# Patient Record
Sex: Female | Born: 1995 | Race: Black or African American | Hispanic: No | Marital: Single | Smoking: Never smoker
Health system: Southern US, Community
[De-identification: ages and names within clinical notes are randomized; demographics above are authoritative.]

## PROBLEM LIST (undated history)

## (undated) ENCOUNTER — Inpatient Hospital Stay (HOSPITAL_COMMUNITY): Payer: Self-pay

## (undated) DIAGNOSIS — K219 Gastro-esophageal reflux disease without esophagitis: Secondary | ICD-10-CM

## (undated) DIAGNOSIS — F339 Major depressive disorder, recurrent, unspecified: Secondary | ICD-10-CM

## (undated) DIAGNOSIS — F4320 Adjustment disorder, unspecified: Secondary | ICD-10-CM

## (undated) DIAGNOSIS — F819 Developmental disorder of scholastic skills, unspecified: Secondary | ICD-10-CM

## (undated) DIAGNOSIS — E559 Vitamin D deficiency, unspecified: Secondary | ICD-10-CM

## (undated) DIAGNOSIS — S0591XA Unspecified injury of right eye and orbit, initial encounter: Secondary | ICD-10-CM

## (undated) DIAGNOSIS — F909 Attention-deficit hyperactivity disorder, unspecified type: Secondary | ICD-10-CM

## (undated) DIAGNOSIS — D563 Thalassemia minor: Secondary | ICD-10-CM

## (undated) HISTORY — DX: Major depressive disorder, recurrent, unspecified: F33.9

## (undated) HISTORY — DX: Gastro-esophageal reflux disease without esophagitis: K21.9

## (undated) HISTORY — DX: Developmental disorder of scholastic skills, unspecified: F81.9

## (undated) HISTORY — DX: Attention-deficit hyperactivity disorder, unspecified type: F90.9

---
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
## 2017-02-04 ENCOUNTER — Other Ambulatory Visit: Payer: Self-pay

## 2017-02-04 ENCOUNTER — Encounter (HOSPITAL_COMMUNITY): Payer: Self-pay | Admitting: Emergency Medicine

## 2017-02-04 ENCOUNTER — Ambulatory Visit (HOSPITAL_COMMUNITY)
Admission: EM | Admit: 2017-02-04 | Discharge: 2017-02-04 | Disposition: A | Discharge status: Home / Self Care | Payer: 59 | Attending: Family Medicine | Admitting: Family Medicine

## 2017-02-04 DIAGNOSIS — N898 Other specified noninflammatory disorders of vagina: Secondary | ICD-10-CM | POA: Diagnosis not present

## 2017-02-04 DIAGNOSIS — Z113 Encounter for screening for infections with a predominantly sexual mode of transmission: Secondary | ICD-10-CM | POA: Diagnosis not present

## 2017-02-04 DIAGNOSIS — R112 Nausea with vomiting, unspecified: Secondary | ICD-10-CM | POA: Insufficient documentation

## 2017-02-04 DIAGNOSIS — R109 Unspecified abdominal pain: Secondary | ICD-10-CM | POA: Diagnosis not present

## 2017-02-04 DIAGNOSIS — Z202 Contact with and (suspected) exposure to infections with a predominantly sexual mode of transmission: Secondary | ICD-10-CM | POA: Diagnosis not present

## 2017-02-04 DIAGNOSIS — F909 Attention-deficit hyperactivity disorder, unspecified type: Secondary | ICD-10-CM | POA: Insufficient documentation

## 2017-02-04 DIAGNOSIS — Z3202 Encounter for pregnancy test, result negative: Secondary | ICD-10-CM

## 2017-02-04 LAB — POCT URINALYSIS DIP (DEVICE)
Bilirubin Urine: NEGATIVE
Glucose, UA: NEGATIVE mg/dL
Ketones, ur: NEGATIVE mg/dL
Leukocytes, UA: NEGATIVE
NITRITE: NEGATIVE
PH: 6.5 (ref 5.0–8.0)
Protein, ur: NEGATIVE mg/dL
Specific Gravity, Urine: 1.025 (ref 1.005–1.030)
UROBILINOGEN UA: 0.2 mg/dL (ref 0.0–1.0)

## 2017-02-04 LAB — POCT PREGNANCY, URINE: Preg Test, Ur: NEGATIVE

## 2017-02-04 MED ORDER — AZITHROMYCIN 250 MG PO TABS
1000.0000 mg | ORAL_TABLET | Freq: Once | ORAL | Status: AC
  Administered 2017-02-04: 1000 mg via ORAL

## 2017-02-04 MED ORDER — ONDANSETRON 4 MG PO TBDP
ORAL_TABLET | ORAL | Status: AC
  Filled 2017-02-04: qty 1

## 2017-02-04 MED ORDER — CEFTRIAXONE SODIUM 250 MG IJ SOLR
INTRAMUSCULAR | Status: AC
  Filled 2017-02-04: qty 250

## 2017-02-04 MED ORDER — AZITHROMYCIN 250 MG PO TABS
ORAL_TABLET | ORAL | Status: AC
Start: 2017-02-04 — End: ?
  Filled 2017-02-04: qty 4

## 2017-02-04 MED ORDER — ONDANSETRON HCL 4 MG PO TABS: 4.0000 mg | ORAL_TABLET | Freq: Four times a day (QID) | ORAL | 0 refills | Status: AC

## 2017-02-04 MED ORDER — CEFTRIAXONE SODIUM 250 MG IJ SOLR
250.0000 mg | Freq: Once | INTRAMUSCULAR | Status: AC
  Administered 2017-02-04: 250 mg via INTRAMUSCULAR

## 2017-02-04 MED ORDER — ONDANSETRON 4 MG PO TBDP
4.0000 mg | ORAL_TABLET | Freq: Once | ORAL | Status: AC
  Administered 2017-02-04: 4 mg via ORAL

## 2017-02-04 NOTE — Discharge Instructions (Signed)
We have treated you today for gonorrhea and chlamydia, with rocephin and azithromycin. Please refrain from sexual intercourse for 7 days while medicines eliminating infection.   We are testing you for Gonorrhea, Chlamydia, Trichomonas, Yeast and Bacterial Vaginosis. We will call you if anything is positive and let you know if you require any further treatment. Please inform partners of any positive results.   Please return if symptoms not improving with treatment, development of fever, persistent nausea, vomiting, worsening abdominal pain.   Nausea: Pregnancy test was negative. I have sent in zofran for nausea and your nausea and diarrhea may be viral. Please continue to monitor and return if not improving.   For nausea: Zofran prescribed. Begin with every 6 hours, than as you are able to hold food down, take it as needed. Start with clear liquids, then move to plain foods like bananas, rice, applesauce, toast, broth, grits, oatmeal. As those food settle okay you may transition to your normal foods. Avoid spicy and greasy foods as much as possible.  Preventing dehydration is key! You need to replace the fluid your body is expelling. Drink plenty of fluids, may use Pedialyte or sports drinks.   Please return if you are experiencing blood in your vomit or stool or experiencing dizziness, lightheadedness, extreme fatigue, increased abdominal pain.

## 2017-02-04 NOTE — ED Triage Notes (Signed)
Pt reports a cramping and "sour feeling" in her stomach x1 week along with vomiting.  Pt was told two weeks ago that she should be tested for STD's, but she does not know what she may have been exposed to.

## 2017-02-04 NOTE — ED Provider Notes (Signed)
[facility]    CSN: [id number] Arrival date & time: 02/04/17  5284     History   Chief Complaint Chief Complaint  Patient presents with  . Abdominal Pain  . Emesis  . Exposure to STD    HPI Virginia Bennett is a 22 y.o. female presenting with abdominal cramping, nausea/vomiting and concern over STD. She states her female friend advised her to get treated for STDs as he was treated. He was unsure what he was treated for or what they gave him. Patient does endorse noticing a discharge that began around the same time. Denies pelvic pain. Dysuria. Does feel she has had increased frequency. Had diarrhea mon-wed of last week but is returning to normal. Also has had crampy or "heavy" sensation in her abdomen for the past week. Developed some nausea and vomiting over the past couple of days. Denies burning sensation in chest or chest pain. Decreased food intake but still drinking well. Denies fevers and back pain. Denies rashes and lesions in genital area.  LMP early December, uses Nexplanon for [facility]. Periods are normally light and irregular with nexplanon.   HPI  Past Medical History:  Diagnosis Date  . ADHD (attention deficit hyperactivity disorder)   . Learning disability    history of IEP beginning in 5th grade  . Vitamin D deficiency disease     Patient Active Problem List   Diagnosis Date Noted  . Surveillance of implantable subdermal contraceptive 04/07/2014  . History of ADHD 04/07/2014  . BMI (body mass index), pediatric, greater than or equal to 95% for age 04/30/2014  . Behavior causing concern in biological child 03/01/2014  . Acne vulgaris 03/01/2014  . Slow transit constipation 03/01/2014    History reviewed. No pertinent surgical history.  OB History    No data available       Home Medications    Prior to Admission medications   Medication Sig Start Date End Date Taking? Authorizing Provider  norethindrone-ethinyl estradiol (JUNEL FE 1/20) 1-20  MG-MCG tablet Take 1 tablet by mouth daily. 06/20/16  Yes Hochman-Segal, Dahlia Client, MD  benzonatate (TESSALON) 100 MG capsule Take 1 capsule (100 mg total) by mouth every 8 (eight) hours. 02/21/16   Dorena Bodo, NP  cholecalciferol (VITAMIN D) 1000 UNITS tablet Take 2,000 Units by mouth daily.    [provider]  Ferrous Sulfate (IRON) 325 (65 Fe) MG TABS Take 325 mg by mouth 2 (two) times daily. 06/20/16   Hochman-Segal, Dahlia Client, MD  HYDROcodone-acetaminophen (NORCO) 10-325 MG tablet Take 1 tablet by mouth every 6 (six) hours as needed.    Lenn Sink, DPM  HYDROcodone-acetaminophen (NORCO) 10-325 MG tablet Take 1 tablet by mouth every 4 (four) hours as needed.    Lenn Sink, DPM  ondansetron (ZOFRAN) 4 MG tablet Take 1 tablet (4 mg total) by mouth every 6 (six) hours for 5 days. 02/04/17 02/09/17  Wieters, Hallie C, PA-C  polyethylene glycol powder (GLYCOLAX/MIRALAX) powder Take 17 g by mouth daily. 06/20/16   Jillyn Ledger, MD    Family History Family History  Problem Relation Age of Onset  . Diabetes Father   . Asthma Sister   . Asthma Sister     Social History Social History   Tobacco Use  . Smoking status: Never Smoker  . Smokeless tobacco: Never Used  Substance Use Topics  . Alcohol use: No    Alcohol/week: 0.0 oz  . Drug use: No     Allergies   Aspirin  Review of Systems Review of Systems  Constitutional: Negative for fever.  Respiratory: Negative for shortness of breath.   Cardiovascular: Negative for chest pain.  Gastrointestinal: Positive for abdominal pain, diarrhea, nausea and vomiting.  Genitourinary: Positive for frequency and vaginal discharge. Negative for dysuria, flank pain, genital sores, hematuria, menstrual problem, pelvic pain, vaginal bleeding and vaginal pain.  Musculoskeletal: Negative for back pain.  Skin: Negative for rash.  Neurological: Negative for dizziness, light-headedness and headaches.     Physical Exam Triage  Vital Signs ED Triage Vitals  Enc Vitals Group     BP 02/04/17 1019 (P) 126/63     Pulse Rate 02/04/17 1019 (P) 72     Resp 02/04/17 1019 (P) 16     Temp 02/04/17 1019 (P) 98 F (36.7 C)     Temp Source 02/04/17 1019 (P) Oral     SpO2 02/04/17 1019 (P) 100 %     Weight --      Height --      Head Circumference --      Peak Flow --      Pain Score 02/04/17 1025 7     Pain Loc --      Pain Edu? --      Excl. in GC? --    No data found.  Updated Vital Signs BP (P) 126/63 (BP Location: Right Arm)   Pulse (P) 72   Temp (P) 98 F (36.7 C) (Oral)   Resp (P) 16   LMP 12/22/2016 (Approximate)   SpO2 (P) 100%    Physical Exam  Constitutional: She appears well-developed and well-nourished. No distress.  HENT:  Head: Normocephalic and atraumatic.  Eyes: Conjunctivae are normal.  Neck: Neck supple.  Cardiovascular: Normal rate and regular rhythm.  No murmur heard. Pulmonary/Chest: Effort normal and breath sounds normal. No respiratory distress.  Abdominal: Soft. There is generalized tenderness. There is no rigidity, no rebound, no guarding, no CVA tenderness, no tenderness at McBurney's point and negative Murphy's sign.  Diffusely tender throughout abdomen including epigastrium.  Musculoskeletal: She exhibits no edema.  Neurological: She is alert.  Skin: Skin is warm and dry.  Psychiatric: She has a normal mood and affect.  Nursing note and vitals reviewed.    UC Treatments / Results  Labs (all labs ordered are listed, but only abnormal results are displayed) Labs Reviewed  POCT URINALYSIS DIP (DEVICE) - Abnormal; Notable for the following components:      Result Value   Hgb urine dipstick TRACE (*)    All other components within normal limits  POCT PREGNANCY, URINE  CERVICOVAGINAL ANCILLARY ONLY    EKG  EKG Interpretation None       Radiology No results found.  Procedures Procedures (including critical care time)  Medications Ordered in UC Medications    azithromycin (ZITHROMAX) tablet 1,000 mg (not administered)  cefTRIAXone (ROCEPHIN) injection 250 mg (not administered)     Initial Impression / Assessment and Plan / UC Course  I have reviewed the triage vital signs and the nursing notes.  Pertinent labs & imaging results that were available during my care of the patient were reviewed by me and considered in my medical decision making (see chart for details).     Pregnancy test negative. Patient with vaginal discharge/concern over STD. Screening for STD's performed, will call to inform of positive results. Patient requesting empiric treatment today gave rocephin and azithromycin.  Also with nausea, appears it may be viral related as associated with diarrhea, also  likely the cause of abdominal pain. PID unlikely, patient comfortable, no fevers, abdomen not acute. Zofran prescribed with dietary precautions.   Discussed strict return precautions. Patient verbalized understanding and is agreeable with plan.    Final Clinical Impressions(s) / UC Diagnoses   Final diagnoses:  Vaginal discharge  Nausea and vomiting, intractability of vomiting not specified, unspecified vomiting type    ED Discharge Orders        Ordered    ondansetron (ZOFRAN) 4 MG tablet  Every 6 hours     02/04/17 1054       Controlled Substance Prescriptions [place] Controlled Substance Registry consulted? Not Applicable   Lew DawesWieters, Hallie C, [place] JerseyPA-C 02/04/17 1203

## 2017-02-05 LAB — CERVICOVAGINAL ANCILLARY ONLY
Bacterial vaginitis: POSITIVE — AB
CANDIDA VAGINITIS: NEGATIVE
CHLAMYDIA, DNA PROBE: NEGATIVE
Neisseria Gonorrhea: NEGATIVE
TRICH (WINDOWPATH): NEGATIVE

## 2017-02-06 ENCOUNTER — Telehealth (HOSPITAL_COMMUNITY): Payer: Self-pay | Admitting: Emergency Medicine

## 2017-02-06 MED ORDER — METRONIDAZOLE 500 MG PO TABS: 500.0000 mg | ORAL_TABLET | Freq: Two times a day (BID) | ORAL | 0 refills | Status: DC

## 2017-02-06 NOTE — Telephone Encounter (Signed)
-----   Message from Isa RankinLaura Wilson Murray, MD sent at 02/06/2017  7:36 AM EST ----- Clinical staff, please let patient know that test for gardnerella (bacterial vaginosis) was positive.  This only needs to be treated if there are persistent symptoms, such as vaginal irritation/discharge.   If these symptoms are present, ok to send rx for metronidazole 500mg  bid x 7d #14 no refills or metronidazole vaginal gel 0.75% 1 applicatorful bid x 7d #14 no refills.  Recheck or followup with PCP for further evaluation if symptoms are not improving.  LM

## 2017-02-06 NOTE — Telephone Encounter (Signed)
Called pt to notify of recent lab results.... Pt ID'd properly Reports persistent vag d/c.  [facility]  Rx sent per her request.  Adv pt if sx are not getting better to return or to f/u w/PCP Education on safe sex given Notified pt that lab results can be obtained through MyChart Pt verb understanding.

## 2017-03-18 ENCOUNTER — Encounter (HOSPITAL_COMMUNITY): Payer: Self-pay | Admitting: *Deleted

## 2017-03-18 ENCOUNTER — Other Ambulatory Visit: Payer: Self-pay

## 2017-03-18 ENCOUNTER — Inpatient Hospital Stay (HOSPITAL_COMMUNITY)
Admission: AD | Admit: 2017-03-18 | Discharge: 2017-03-23 | DRG: 885 | Disposition: A | Discharge status: Home / Self Care | Payer: 59 | Source: Intra-hospital | Attending: Psychiatry | Admitting: Psychiatry

## 2017-03-18 ENCOUNTER — Emergency Department (HOSPITAL_COMMUNITY)
Admission: EM | Admit: 2017-03-18 | Discharge: 2017-03-18 | Disposition: A | Discharge status: Other Acute Inpatient Hospital | Payer: 59 | Attending: Emergency Medicine | Admitting: Emergency Medicine

## 2017-03-18 ENCOUNTER — Encounter (HOSPITAL_COMMUNITY): Payer: Self-pay | Admitting: Emergency Medicine

## 2017-03-18 DIAGNOSIS — X789XXA Intentional self-harm by unspecified sharp object, initial encounter: Secondary | ICD-10-CM | POA: Diagnosis not present

## 2017-03-18 DIAGNOSIS — F323 Major depressive disorder, single episode, severe with psychotic features: Secondary | ICD-10-CM | POA: Diagnosis present

## 2017-03-18 DIAGNOSIS — Z79899 Other long term (current) drug therapy: Secondary | ICD-10-CM | POA: Diagnosis not present

## 2017-03-18 DIAGNOSIS — Z046 Encounter for general psychiatric examination, requested by authority: Secondary | ICD-10-CM | POA: Insufficient documentation

## 2017-03-18 DIAGNOSIS — T1491XA Suicide attempt, initial encounter: Secondary | ICD-10-CM | POA: Diagnosis not present

## 2017-03-18 DIAGNOSIS — F1290 Cannabis use, unspecified, uncomplicated: Secondary | ICD-10-CM | POA: Diagnosis not present

## 2017-03-18 DIAGNOSIS — F4320 Adjustment disorder, unspecified: Secondary | ICD-10-CM | POA: Diagnosis present

## 2017-03-18 DIAGNOSIS — Z886 Allergy status to analgesic agent status: Secondary | ICD-10-CM

## 2017-03-18 DIAGNOSIS — K219 Gastro-esophageal reflux disease without esophagitis: Secondary | ICD-10-CM | POA: Diagnosis present

## 2017-03-18 DIAGNOSIS — F332 Major depressive disorder, recurrent severe without psychotic features: Secondary | ICD-10-CM | POA: Diagnosis not present

## 2017-03-18 DIAGNOSIS — G4700 Insomnia, unspecified: Secondary | ICD-10-CM | POA: Diagnosis not present

## 2017-03-18 DIAGNOSIS — F1390 Sedative, hypnotic, or anxiolytic use, unspecified, uncomplicated: Secondary | ICD-10-CM | POA: Diagnosis not present

## 2017-03-18 DIAGNOSIS — R45851 Suicidal ideations: Secondary | ICD-10-CM | POA: Diagnosis not present

## 2017-03-18 DIAGNOSIS — Z818 Family history of other mental and behavioral disorders: Secondary | ICD-10-CM | POA: Diagnosis not present

## 2017-03-18 DIAGNOSIS — F909 Attention-deficit hyperactivity disorder, unspecified type: Secondary | ICD-10-CM | POA: Diagnosis present

## 2017-03-18 DIAGNOSIS — R440 Auditory hallucinations: Secondary | ICD-10-CM | POA: Diagnosis not present

## 2017-03-18 DIAGNOSIS — F329 Major depressive disorder, single episode, unspecified: Secondary | ICD-10-CM | POA: Diagnosis present

## 2017-03-18 DIAGNOSIS — Z639 Problem related to primary support group, unspecified: Secondary | ICD-10-CM

## 2017-03-18 LAB — CBC
HCT: 42.2 % (ref 36.0–46.0)
HEMOGLOBIN: 13.9 g/dL (ref 12.0–15.0)
MCH: 28 pg (ref 26.0–34.0)
MCHC: 32.9 g/dL (ref 30.0–36.0)
MCV: 85.1 fL (ref 78.0–100.0)
Platelets: 307 10*3/uL (ref 150–400)
RBC: 4.96 MIL/uL (ref 3.87–5.11)
RDW: 13.3 % (ref 11.5–15.5)
WBC: 4 10*3/uL (ref 4.0–10.5)

## 2017-03-18 LAB — COMPREHENSIVE METABOLIC PANEL
ALBUMIN: 4.5 g/dL (ref 3.5–5.0)
ALK PHOS: 73 U/L (ref 38–126)
ALT: 16 U/L (ref 14–54)
ANION GAP: 9 (ref 5–15)
AST: 20 U/L (ref 15–41)
BILIRUBIN TOTAL: 0.6 mg/dL (ref 0.3–1.2)
BUN: 10 mg/dL (ref 6–20)
CALCIUM: 9.2 mg/dL (ref 8.9–10.3)
CO2: 23 mmol/L (ref 22–32)
Chloride: 104 mmol/L (ref 101–111)
Creatinine, Ser: 0.77 mg/dL (ref 0.44–1.00)
GFR calc Af Amer: >60 mL/min (ref >60)
GLUCOSE: 89 mg/dL (ref 65–99)
Potassium: 3.7 mmol/L (ref 3.5–5.1)
Sodium: 136 mmol/L (ref 135–145)
TOTAL PROTEIN: 8 g/dL (ref 6.5–8.1)

## 2017-03-18 LAB — RAPID URINE DRUG SCREEN, HOSP PERFORMED
Amphetamines: NOT DETECTED
Barbiturates: NOT DETECTED
Benzodiazepines: POSITIVE — AB
COCAINE: NOT DETECTED
OPIATES: NOT DETECTED
TETRAHYDROCANNABINOL: POSITIVE — AB

## 2017-03-18 LAB — I-STAT BETA HCG BLOOD, ED (MC, WL, AP ONLY): I-stat hCG, quantitative: <5 m[IU]/mL (ref <5)

## 2017-03-18 MED ORDER — ESCITALOPRAM OXALATE 10 MG PO TABS
10.0000 mg | ORAL_TABLET | Freq: Every day | ORAL | Status: DC
  Administered 2017-03-19 – 2017-03-23 (×5): 10 mg via ORAL
  Filled 2017-03-18 (×7): qty 1

## 2017-03-18 MED ORDER — ZOLPIDEM TARTRATE 5 MG PO TABS: 5.0000 mg | ORAL_TABLET | Freq: Every evening | ORAL | Status: DC | PRN

## 2017-03-18 MED ORDER — ACETAMINOPHEN 325 MG PO TABS: 650.0000 mg | ORAL_TABLET | ORAL | Status: DC | PRN

## 2017-03-18 MED ORDER — LORAZEPAM 1 MG PO TABS: 1.0000 mg | ORAL_TABLET | ORAL | Status: DC | PRN

## 2017-03-18 MED ORDER — ACETAMINOPHEN 325 MG PO TABS: 650.0000 mg | ORAL_TABLET | Freq: Four times a day (QID) | ORAL | Status: DC | PRN

## 2017-03-18 MED ORDER — HYDROXYZINE HCL 25 MG PO TABS
25.0000 mg | ORAL_TABLET | Freq: Three times a day (TID) | ORAL | Status: DC | PRN
  Administered 2017-03-19 – 2017-03-22 (×4): 25 mg via ORAL
  Filled 2017-03-18 (×3): qty 1

## 2017-03-18 MED ORDER — TRAZODONE HCL 50 MG PO TABS
50.0000 mg | ORAL_TABLET | Freq: Every evening | ORAL | Status: DC | PRN
  Administered 2017-03-18 – 2017-03-22 (×5): 50 mg via ORAL
  Filled 2017-03-18 (×3): qty 1

## 2017-03-18 MED ORDER — ALUM & MAG HYDROXIDE-SIMETH 200-200-20 MG/5ML PO SUSP: 30.0000 mL | ORAL | Status: DC | PRN

## 2017-03-18 MED ORDER — LORAZEPAM 0.5 MG PO TABS
0.5000 mg | ORAL_TABLET | Freq: Three times a day (TID) | ORAL | Status: DC | PRN
  Administered 2017-03-18: 0.5 mg via ORAL
  Filled 2017-03-18: qty 1

## 2017-03-18 MED ORDER — ESCITALOPRAM OXALATE 10 MG PO TABS: 10.0000 mg | ORAL_TABLET | Freq: Every day | ORAL | Status: DC

## 2017-03-18 MED ORDER — PANTOPRAZOLE SODIUM 40 MG PO TBEC
40.0000 mg | DELAYED_RELEASE_TABLET | Freq: Every evening | ORAL | Status: DC
  Administered 2017-03-18 – 2017-03-22 (×5): 40 mg via ORAL
  Filled 2017-03-18 (×7): qty 1

## 2017-03-18 MED ORDER — ZIPRASIDONE MESYLATE 20 MG IM SOLR: 20.0000 mg | Freq: Two times a day (BID) | INTRAMUSCULAR | Status: DC | PRN

## 2017-03-18 MED ORDER — LORAZEPAM 0.5 MG PO TABS: 0.5000 mg | ORAL_TABLET | Freq: Three times a day (TID) | ORAL | Status: DC | PRN

## 2017-03-18 MED ORDER — MAGNESIUM HYDROXIDE 400 MG/5ML PO SUSP: 30.0000 mL | Freq: Every day | ORAL | Status: DC | PRN

## 2017-03-18 MED ORDER — PANTOPRAZOLE SODIUM 40 MG PO TBEC: 40.0000 mg | DELAYED_RELEASE_TABLET | Freq: Every day | ORAL | Status: DC

## 2017-03-18 MED ORDER — ALUM & MAG HYDROXIDE-SIMETH 200-200-20 MG/5ML PO SUSP: 30.0000 mL | Freq: Four times a day (QID) | ORAL | Status: DC | PRN

## 2017-03-18 MED ORDER — ONDANSETRON HCL 4 MG PO TABS: 4.0000 mg | ORAL_TABLET | Freq: Three times a day (TID) | ORAL | Status: DC | PRN

## 2017-03-18 NOTE — ED Provider Notes (Signed)
[facility] Provider Note   CSN: [id number] Arrival date & time: 03/18/17  1133     History   Chief Complaint Chief Complaint  Patient presents with  . Suicidal    HPI Virginia Bennett is a 22 y.o. female.  HPI 21 year old female with history of ADHD comes in with chief complaint of suicidal thoughts.  Patient states that she has a long history of having suicidal thoughts off and on, however over the past few days her thoughts have gotten stronger.  Over the past week patient also has started developing plans on hurting herself, and last night she tried to drown herself in her bathtub.  Patient is also thought about stabbing herself to death.  Patient also thinks that she is hearing voices that is asking her to hurt herself.   Patient does not carry a formal diagnosis of depression.  She was seen by PCP recently and started on new antidepressant and new antianxiety medication.  Patient has no history of substance abuse or heavy drinking.   Pt denies nausea, emesis, fevers, chills, chest pains, shortness of breath, headaches, abdominal pain, uti like symptoms.   Past Medical History:  Diagnosis Date  . ADHD (attention deficit hyperactivity disorder)   . Learning disability    history of IEP beginning in 5th grade  . Vitamin D deficiency disease     Patient Active Problem List   Diagnosis Date Noted  . Surveillance of implantable subdermal contraceptive 04/07/2014  . History of ADHD 04/07/2014  . BMI (body mass index), pediatric, greater than or equal to 95% for age 42/10/2014  . Behavior causing concern in biological child 03/01/2014  . Acne vulgaris 03/01/2014  . Slow transit constipation 03/01/2014    History reviewed. No pertinent surgical history.  OB History    No data available       Home Medications    Prior to Admission medications   Medication Sig Start Date End Date Taking? Authorizing Provider  escitalopram  (LEXAPRO) 10 MG tablet Take 10 mg by mouth daily.   Yes [provider]  etonogestrel (NEXPLANON) 68 MG IMPL implant 1 each by Subdermal route once. Placed 02/2016   Yes [provider]  LORazepam (ATIVAN) 0.5 MG tablet Take 0.5 mg by mouth 3 (three) times daily as needed for anxiety.   Yes [provider]  ranitidine (ZANTAC) 300 MG tablet Take 300 mg by mouth at bedtime.   Yes [provider]  metroNIDAZOLE (FLAGYL) 500 MG tablet Take 1 tablet (500 mg total) by mouth 2 (two) times daily. Patient not taking: Reported on 03/18/2017 02/06/17   Isa Rankin, MD  norethindrone-ethinyl estradiol (JUNEL FE 1/20) 1-20 MG-MCG tablet Take 1 tablet by mouth daily. Patient not taking: Reported on 03/18/2017 06/20/16   Jillyn Ledger, MD    Family History Family History  Problem Relation Age of Onset  . Diabetes Father   . Asthma Sister   . Asthma Sister     Social History Social History   Tobacco Use  . Smoking status: Never Smoker  . Smokeless tobacco: Never Used  Substance Use Topics  . Alcohol use: No    Alcohol/week: 0.0 oz  . Drug use: No     Allergies   Aspirin   Review of Systems Review of Systems  Constitutional: Positive for activity change.  Psychiatric/Behavioral: Positive for suicidal ideas.  All other systems reviewed and are negative.    Physical Exam Updated Vital Signs BP 109/87 (  BP Location: Left Arm)   Pulse 77   Temp 98.5 F (36.9 C) (Oral)   Resp 14   SpO2 100%   Physical Exam  Constitutional: She is oriented to person, place, and time. She appears well-developed.  HENT:  Head: Normocephalic and atraumatic.  Eyes: EOM are normal.  Neck: Normal range of motion. Neck supple.  Cardiovascular: Normal rate.  Pulmonary/Chest: Effort normal.  Abdominal: Bowel sounds are normal.  Neurological: She is alert and oriented to person, place, and time.  Skin: Skin is warm and dry.  Psychiatric:  Suicidal  thoughts  Nursing note and vitals reviewed.    ED Treatments / Results  Labs (all labs ordered are listed, but only abnormal results are displayed) Labs Reviewed  RAPID URINE DRUG SCREEN, HOSP PERFORMED - Abnormal; Notable for the following components:      Result Value   Benzodiazepines POSITIVE (*)    Tetrahydrocannabinol POSITIVE (*)    All other components within normal limits  COMPREHENSIVE METABOLIC PANEL  CBC  ETHANOL  SALICYLATE LEVEL  ACETAMINOPHEN LEVEL  I-STAT BETA HCG BLOOD, ED ([facility], [facility], [facility] ONLY)    EKG  EKG Interpretation None       Radiology No results found.  Procedures Procedures (including critical care time)  Medications Ordered in ED Medications  ziprasidone (GEODON) injection 20 mg (not administered)    And  LORazepam (ATIVAN) tablet 1 mg (not administered)  acetaminophen (TYLENOL) tablet 650 mg (not administered)  zolpidem (AMBIEN) tablet 5 mg (not administered)  ondansetron (ZOFRAN) tablet 4 mg (not administered)  alum & mag hydroxide-simeth (MAALOX/MYLANTA) 200-200-20 MG/5ML suspension 30 mL (not administered)  escitalopram (LEXAPRO) tablet 10 mg (10 mg Oral Not Given 03/18/17 1458)  LORazepam (ATIVAN) tablet 0.5 mg (not administered)     Initial Impression / Assessment and Plan / ED Course  I have reviewed the triage vital signs and the nursing notes.  Pertinent labs & imaging results that were available during my care of the patient were reviewed by me and considered in my medical decision making (see chart for details).    22 year old female with no known history of depression comes in with chief complaint of suicidal thoughts with an active plan.  Patient was recently started on Lexapro.  There could be an underlying psychosis as well  Plan is to consult TTS for further evaluation. \Patient is medically cleared.  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

## 2017-03-18 NOTE — BH Assessment (Signed)
[facility] Assessment Progress Note  [facility] to come at anytime. Support paperwork completed and faxed to [phone]. Call report to [phone]. Pt's RN, Wille CelesteJanie, notified.   Johny ShockSamantha M. Ladona Ridgelaylor, MS, NCC, LPCA Counselor

## 2017-03-18 NOTE — ED Notes (Signed)
Bed: [facility]PT4 Expected date:  Expected time:  Means of arrival:  Comments: 

## 2017-03-18 NOTE — ED Notes (Signed)
Pt reports stomach ache.  Has not eaten lunch and thinks it's her "nerves."

## 2017-03-18 NOTE — ED Notes (Signed)
[facility]. Pt has no personal property . Pt signed e-signature. Ambulatory off unit.

## 2017-03-18 NOTE — ED Notes (Signed)
Pt ambulatory w/o difficulty to room 37.  Pt's mother has her belongings per triage rn.

## 2017-03-18 NOTE — ED Notes (Addendum)
Pt is [facility] that [facility] and will transport at later time.  Pt will notify her mom.

## 2017-03-18 NOTE — ED Notes (Signed)
On the phone 

## 2017-03-18 NOTE — ED Notes (Signed)
Bed: [facility]37 Expected date:  Expected time:  Means of arrival:  Comments: Triage 4  

## 2017-03-18 NOTE — ED Notes (Signed)
Pt's mom into see 

## 2017-03-18 NOTE — ED Triage Notes (Signed)
Patient presents ambulatory with mother stating she has been having suicidal thoughts for past 10 days. Patients mother took her to PCP and was placed on ativan 0.5 mg PRN. Pt reports hearing voices in her head telling her to hurt herself. Pt attempted to cut wrist with knife today and states she attempted to drown herself a few days ago. Patient tearful in triage but cooperative.

## 2017-03-18 NOTE — Tx Team (Signed)
Initial Treatment Plan 03/18/2017 7:54 PM Virginia Bennett [id number]    PATIENT STRESSORS: Educational concerns Marital or family conflict Substance abuse   PATIENT STRENGTHS: Ability for insight Average or above average intelligence Capable of independent living General fund of knowledge Motivation for treatment/growth Supportive family/friends   PATIENT IDENTIFIED PROBLEMS: Depression Panic attacks Suicidal thoughts "Work on self-esteem and coping skills"                     DISCHARGE CRITERIA:  Ability to meet basic life and health needs Improved stabilization in mood, thinking, and/or behavior Reduction of life-threatening or endangering symptoms to within safe limits Verbal commitment to aftercare and medication compliance  PRELIMINARY DISCHARGE PLAN: Attend aftercare/continuing care group Return to previous living arrangement  PATIENT/FAMILY INVOLVEMENT: This treatment plan has been presented to and reviewed with the patient, Virginia Bennett, and/or family member, .  The patient and family have been given the opportunity to ask questions and make suggestions.  Mabell Esguerra, [place], [place]RN 03/18/2017, 7:54 PM

## 2017-03-18 NOTE — ED Notes (Signed)
TTS into see 
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
## 2017-03-18 NOTE — Progress Notes (Signed)
Virginia Bennett is a 22 year old female pt admitted on voluntary basis. On admission, she spoke about how she has been feeling depressed and having auditory hallucinations the past 5 days with the voices telling her to kill herself. She did report how her boyfriend got another girl pregnant as a stressor. She reports that she was just prescribed lexapro by her PCP this past Friday and reports that she has been taking as prescribed. She does endorse marijuana usage but denies any other substance abuse issues. She reports that she lives in the home with her mother, brother and sister and will go back to the same situation when she gets discharged. Virginia Bennett was oriented to the unit and safety maintained.

## 2017-03-18 NOTE — ED Notes (Signed)
Patient dressed out and wanded by security.  

## 2017-03-19 DIAGNOSIS — T1491XA Suicide attempt, initial encounter: Secondary | ICD-10-CM

## 2017-03-19 DIAGNOSIS — F332 Major depressive disorder, recurrent severe without psychotic features: Secondary | ICD-10-CM

## 2017-03-19 DIAGNOSIS — G4700 Insomnia, unspecified: Secondary | ICD-10-CM

## 2017-03-19 DIAGNOSIS — Z818 Family history of other mental and behavioral disorders: Secondary | ICD-10-CM

## 2017-03-19 DIAGNOSIS — F1390 Sedative, hypnotic, or anxiolytic use, unspecified, uncomplicated: Secondary | ICD-10-CM

## 2017-03-19 DIAGNOSIS — F4320 Adjustment disorder, unspecified: Secondary | ICD-10-CM

## 2017-03-19 DIAGNOSIS — F1290 Cannabis use, unspecified, uncomplicated: Secondary | ICD-10-CM

## 2017-03-19 DIAGNOSIS — R440 Auditory hallucinations: Secondary | ICD-10-CM

## 2017-03-19 DIAGNOSIS — X789XXA Intentional self-harm by unspecified sharp object, initial encounter: Secondary | ICD-10-CM

## 2017-03-19 MED ORDER — GABAPENTIN 100 MG PO CAPS
100.0000 mg | ORAL_CAPSULE | Freq: Two times a day (BID) | ORAL | Status: DC
  Administered 2017-03-19 – 2017-03-23 (×8): 100 mg via ORAL
  Filled 2017-03-19 (×11): qty 1

## 2017-03-19 NOTE — Progress Notes (Signed)
Pt attend wrap up group. Her day was a 1. Pt said she's going thru a lot right now. Pt began to cry. RN notify
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
## 2017-03-19 NOTE — Progress Notes (Signed)
Adult Psychoeducational Group Note  Date:  03/19/2017 Time: 1600  Group Topic/Focus:  Aromatherapy  Participation Level:  Active  Participation Quality:  Appropriate  Affect:  Appropriate  Cognitive:  Appropriate  Insight: Appropriate  Engagement in Group:  Engaged  Modes of Intervention:  Education  Additional Comments:    Pate Aylward L 03/19/2017 

## 2017-03-19 NOTE — BHH Suicide Risk Assessment (Signed)
[facility] Admission Suicide Risk Assessment   Nursing information obtained from:    Demographic factors:    Current Mental Status:    Loss Factors:    Historical Factors:    Risk Reduction Factors:     Total Time spent with patient: 45 minutes Principal Problem: MDD Diagnosis:   Patient Active Problem List   Diagnosis Date Noted  . MDD (major depressive disorder), single episode, severe with psychosis (HCC) [F32.3] 03/18/2017  . Surveillance of implantable subdermal contraceptive [Z30.46] 04/07/2014  . History of ADHD [Z86.59] 04/07/2014  . BMI (body mass index), pediatric, greater than or equal to 95% for age [facility] 03/01/2014  . Behavior causing concern in biological child [Z71.89, Z62.820] 03/01/2014  . Acne vulgaris [L70.0] 03/01/2014  . 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 No past history of mental illness. She was referred by her PCP and accompanied by her mother. Patient reports command auditory hallucination to end her own life. She attempted to drown self a couple of days ago. She attempted to slit own wrist on day of admission. Main stressor is recent break-up with her best friend. Routine labs are essentially normal. Toxicology is negative. UDS is positive for THC and Benzo's. No past suicidal behavior, no family history of suicide, no evidence of psychosis. No evidence of mania. No cognitive impairment. No access to weapons. She is cooperative with care. She has agreed to treatment recommendations. She has agreed to communicate suicidal thoughts to staff if the thoughts becomes overwhelming.      Continued Clinical Symptoms:  Alcohol Use Disorder Identification Test Final Score (AUDIT): 0 The "Alcohol Use Disorders Identification Test", Guidelines for Use in Primary Care, Second Edition.  World Science writer [facility]). Score between 0-7:  no or low risk or alcohol related problems. Score  between 8-15:  moderate risk of alcohol related problems. Score between 16-19:  high risk of alcohol related problems. Score 20 or above:  warrants further diagnostic evaluation for alcohol dependence and treatment.   CLINICAL FACTORS:   Depression:   Severe Adjustment Disorder  Musculoskeletal: Strength & Muscle Tone: within normal limits Gait & Station: normal Patient leans: N/A  Psychiatric Specialty Exam: Physical Exam  ROS  Blood pressure 131/77, pulse 93, temperature 98.1 F (36.7 C), temperature source Oral, resp. rate 18, height 5' 4.5" (1.638 m), weight 79.4 kg (175 lb).Body mass index is 29.57 kg/m.  General Appearance: As in H&P  Eye Contact:    Speech:    Volume:    Mood:    Affect:    Thought Process:    Orientation:    Thought Content:  As in H&P  Suicidal Thoughts:    Homicidal Thoughts:    Memory:    Judgement:    Insight:    Psychomotor Activity:    Concentration:    Recall:    Fund of Knowledge:    Language:    Akathisia:    Handed:    AIMS (if indicated):     Assets:    ADL's:    Cognition:  As in H&P  Sleep:  Number of Hours: 6      COGNITIVE FEATURES THAT CONTRIBUTE TO RISK:  None    SUICIDE RISK:   Mild:  Suicidal ideation of limited frequency, intensity, duration, and specificity.  There are no identifiable plans, no associated intent, mild dysphoria and related symptoms, good self-control (both objective and subjective assessment), few other  risk factors, and identifiable protective factors, including available and accessible social support.  PLAN OF CARE:  As in H&P  I certify that inpatient services furnished can reasonably be expected to improve the patient's condition.   Georgiann CockerVincent A Jacqueleen Pulver, MD 03/19/2017, 11:51 AM

## 2017-03-19 NOTE — H&P (Addendum)
Psychiatric Admission Assessment Adult  Patient Identification: Virginia Bennett MRN:  [id number] Date of Evaluation:  03/19/2017 Chief Complaint:  Worsening depression with suicidal thoughts Principal Diagnosis: MDD Diagnosis:   Patient Active Problem List   Diagnosis Date Noted  . MDD (major depressive disorder), single episode, severe with psychosis ([place]) [F32.3] 03/18/2017  . Surveillance of implantable subdermal contraceptive [Z30.46] 04/07/2014  . History of ADHD [Z86.59] 04/07/2014  . BMI (body mass index), pediatric, greater than or equal to 95% for age [facility] 03/01/2014  . Behavior causing concern in biological child [Z71.89, Z62.820] 03/01/2014  . Acne vulgaris [L70.0] 03/01/2014  . Slow transit constipation [K59.01] 03/01/2014   History of Present Illness:  22 y.o AAF, single , employed, lives with her family. No past history of mental illness. She was referred by her PCP and accompanied by her mother. Patient reports command auditory hallucination to end her own life. She attempted to drown self a couple of days ago. She attempted to slit own wrist on day of admission. Main stressor is recent break-up with her best friend. Routine labs are essentially normal. Toxicology is negative. UDS is positive for THC and Benzo's.  At interview, patient reports that she has a strong family history of depression and anxiety disorder. Says she has been depressed for a long time. Says she had coped without any medications. Ten days ago, her best friend decided to end their relationship. Says they have been friends for over three years. Says he was someone she shared everything with. Patient says she has become very down since then. She has not been eating well. She has lost eight pounds. She has been finding it difficult to get into sleep. Has multiple awakening and early morning awakening. Patient says six days ago, she started hearing a voice in her head. She is not show if this is her  conscience. It has been mainly negative themes and urge to end her own life. Says she has never had any abnormal perception until recently. No hallucination in any other modality. No command to hurt others. Patient says she had been using THC since she was 22 years of age. She decided to give it up a couple of days ago. Says she has become more anxious lately. She has become really withdrawn. Her PCP recently started her on Lexapro.  Denies use of any other substance. No synthetic substance use. No evidence of mania. No access to weapons.    Total Time spent with patient: 1 hour  Past Psychiatric History: Long history of subclinical depression. Never been on medication until recently. She has been tolerating Lexapro well. She was also prescribed low dose benzodiazepine during the acute phase.  No past history of mania. No past history of psychosis. No past history of self mutilation. No past history of suicidal behavior. No past history of violent behavior. She has never had any inpatient treatment in the past. No past psychological or physical treatment.    Is the patient at risk to self? Yes.    Has the patient been a risk to self in the past 6 months? No.  Has the patient been a risk to self within the distant past? No.  Is the patient a risk to others? No.  Has the patient been a risk to others in the past 6 months? No.  Has the patient been a risk to others within the distant past? No.   Prior Inpatient Therapy:   Prior Outpatient Therapy:    Alcohol Screening: 1.  How often do you have a drink containing alcohol?: Never 2. How many drinks containing alcohol do you have on a typical day when you are drinking?: 1 or 2 3. How often do you have six or more drinks on one occasion?: Never AUDIT-C Score: 0 4. How often during the last year have you found that you were not able to stop drinking once you had started?: Never 5. How often during the last year have you failed to do what was normally  expected from you becasue of drinking?: Never 6. How often during the last year have you needed a first drink in the morning to get yourself going after a heavy drinking session?: Never 7. How often during the last year have you had a feeling of guilt of remorse after drinking?: Never 8. How often during the last year have you been unable to remember what happened the night before because you had been drinking?: Never 9. Have you or someone else been injured as a result of your drinking?: No 10. Has a relative or friend or a doctor or another health worker been concerned about your drinking or suggested you cut down?: No Alcohol Use Disorder Identification Test Final Score (AUDIT): 0 Intervention/Follow-up: AUDIT Score <7 follow-up not indicated Substance Abuse History in the last 12 months:  Yes.   Consequences of Substance Abuse: Amotivation. Previous Psychotropic Medications: No  Psychological Evaluations: No  Past Medical History:  Past Medical History:  Diagnosis Date  . ADHD (attention deficit hyperactivity disorder)   . Learning disability    history of IEP beginning in 5th grade  . Vitamin D deficiency disease    History reviewed. No pertinent surgical history. Family History:  Family History  Problem Relation Age of Onset  . Diabetes Father   . Asthma Sister   . Asthma Sister    Family Psychiatric  History: Strong family history of MDD and anxiety disorder. No family history of suicide.   Tobacco Screening: Have you used any form of tobacco in the last 30 days? (Cigarettes, Smokeless Tobacco, Cigars, and/or Pipes): No Social History:  Social History   Substance and Sexual Activity  Alcohol Use No  . Alcohol/week: 0.0 oz     Social History   Substance and Sexual Activity  Drug Use Yes  . Types: Marijuana    Additional Social History: Marital status: Single Are you sexually active?: No What is your sexual orientation?: "It doesn't matter"  Has your sexual  activity been affected by drugs, alcohol, medication, or emotional stress?: N/A  Does patient have children?: No                         Allergies:   Allergies  Allergen Reactions  . Aspirin     Father & Siblings are allergic. So Mom doesn't even give to patient.   Lab Results:  Results for orders placed or performed during the hospital encounter of 03/18/17 (from the past 48 hour(s))  Rapid urine drug screen (hospital performed)     Status: Abnormal   Collection Time: 03/18/17 12:56 PM  Result Value Ref Range   Opiates NONE DETECTED NONE DETECTED   Cocaine NONE DETECTED NONE DETECTED   Benzodiazepines POSITIVE (A) NONE DETECTED   Amphetamines NONE DETECTED NONE DETECTED   Tetrahydrocannabinol POSITIVE (A) NONE DETECTED   Barbiturates NONE DETECTED NONE DETECTED    Comment: (NOTE) DRUG SCREEN FOR MEDICAL PURPOSES ONLY.  IF CONFIRMATION IS NEEDED FOR ANY PURPOSE,  NOTIFY LAB WITHIN 5 DAYS. LOWEST DETECTABLE LIMITS FOR URINE DRUG SCREEN Drug Class                     Cutoff (ng/mL) Amphetamine and metabolites    1000 Barbiturate and metabolites    200 Benzodiazepine                 099 Tricyclics and metabolites     300 Opiates and metabolites        300 Cocaine and metabolites        300 THC                            50 Performed at [facility], [place] [address] [place], [place] [address]   Comprehensive metabolic panel     Status: None   Collection Time: 03/18/17  1:05 PM  Result Value Ref Range   Sodium 136 135 - 145 mmol/L   Potassium 3.7 3.5 - 5.1 mmol/L   Chloride 104 101 - 111 mmol/L   CO2 23 22 - 32 mmol/L   Glucose, Bld 89 65 - 99 mg/dL   BUN 10 6 - 20 mg/dL   Creatinine, Ser 0.77 0.44 - 1.00 mg/dL   Calcium 9.2 8.9 - 10.3 mg/dL   Total Protein 8.0 6.5 - 8.1 g/dL   Albumin 4.5 3.5 - 5.0 g/dL   AST 20 15 - 41 U/L   ALT 16 14 - 54 U/L   Alkaline Phosphatase 73 38 - 126 U/L   Total Bilirubin 0.6 0.3 - 1.2 mg/dL   GFR calc non Af  Amer >60 >60 mL/min   GFR calc Af Amer >60 >60 mL/min    Comment: (NOTE) The eGFR has been calculated using the CKD EPI equation. This calculation has not been validated in all clinical situations. eGFR's persistently <60 mL/min signify possible Chronic Kidney Disease.    Anion gap 9 5 - 15    Comment: Performed at [facility], [place] [address], [place], [place] [address]  cbc     Status: None   Collection Time: 03/18/17  1:05 PM  Result Value Ref Range   WBC 4.0 4.0 - 10.5 K/uL   RBC 4.96 3.87 - 5.11 MIL/uL   Hemoglobin 13.9 12.0 - 15.0 g/dL   HCT 42.2 36.0 - 46.0 %   MCV 85.1 78.0 - 100.0 fL   MCH 28.0 26.0 - 34.0 pg   MCHC 32.9 30.0 - 36.0 g/dL   RDW 13.3 11.5 - 15.5 %   Platelets 307 150 - 400 K/uL    Comment: Performed at [facility], [place] [address], [place], [place] [address]  I-Stat beta hCG blood, ED     Status: None   Collection Time: 03/18/17  1:19 PM  Result Value Ref Range   I-stat hCG, quantitative <5.0 <5 mIU/mL   Comment 3            Comment:   GEST. AGE      CONC.  (mIU/mL)   <=1 WEEK        5 - 50     2 WEEKS       50 - 500     3 WEEKS       100 - 10,000     4 WEEKS     1,000 - 30,000        FEMALE AND NON-PREGNANT FEMALE:  LESS THAN 5 mIU/mL     Blood Alcohol level:  No results found for: [facility]  Metabolic Disorder Labs:  Lab Results  Component Value Date   HGBA1C 4.9 06/20/2016   MPG 94 06/20/2016   MPG 105 03/01/2014   No results found for: PROLACTIN Lab Results  Component Value Date   CHOL 122 03/01/2014   TRIG 51 03/01/2014   HDL 37 03/01/2014   CHOLHDL 3.3 03/01/2014   VLDL 10 03/01/2014   LDLCALC 75 03/01/2014    Current Medications: Current Facility-Administered Medications  Medication Dose Route Frequency Provider Last Rate Last Dose  . acetaminophen (TYLENOL) tablet 650 mg  650 mg Oral Q6H PRN Ethelene Hal, NP      . alum & mag hydroxide-simeth (MAALOX/MYLANTA) 200-200-20 MG/5ML  suspension 30 mL  30 mL Oral Q4H PRN Ethelene Hal, NP      . escitalopram (LEXAPRO) tablet 10 mg  10 mg Oral Daily Ethelene Hal, NP   10 mg at 03/19/17 0746  . hydrOXYzine (ATARAX/VISTARIL) tablet 25 mg  25 mg Oral TID PRN Ethelene Hal, NP      . LORazepam (ATIVAN) tablet 0.5 mg  0.5 mg Oral TID PRN Ethelene Hal, NP   0.5 mg at 03/18/17 2146  . ziprasidone (GEODON) injection 20 mg  20 mg Intramuscular Q12H PRN Ethelene Hal, NP       And  . LORazepam (ATIVAN) tablet 1 mg  1 mg Oral PRN Ethelene Hal, NP      . magnesium hydroxide (MILK OF MAGNESIA) suspension 30 mL  30 mL Oral Daily PRN Ethelene Hal, NP      . pantoprazole (PROTONIX) EC tablet 40 mg  40 mg Oral QPM Patriciaann Clan E, PA-C   40 mg at 03/18/17 2147  . traZODone (DESYREL) tablet 50 mg  50 mg Oral QHS PRN Ethelene Hal, NP   50 mg at 03/18/17 2146   PTA Medications: Medications Prior to Admission  Medication Sig Dispense Refill Last Dose  . escitalopram (LEXAPRO) 10 MG tablet Take 10 mg by mouth daily.   03/18/2017 at Unknown time  . etonogestrel (NEXPLANON) 68 MG IMPL implant 1 each by Subdermal route once. Placed 02/2016     . LORazepam (ATIVAN) 0.5 MG tablet Take 0.5 mg by mouth 3 (three) times daily as needed for anxiety.   03/18/2017 at Unknown time  . metroNIDAZOLE (FLAGYL) 500 MG tablet Take 1 tablet (500 mg total) by mouth 2 (two) times daily. (Patient not taking: Reported on 03/18/2017) 14 tablet 0 Not Taking at Unknown time  . norethindrone-ethinyl estradiol (JUNEL FE 1/20) 1-20 MG-MCG tablet Take 1 tablet by mouth daily. (Patient not taking: Reported on 03/18/2017) 1 Package 11 Not Taking at Unknown time  . ranitidine (ZANTAC) 300 MG tablet Take 300 mg by mouth at bedtime.   03/17/2017 at Unknown time    Musculoskeletal: Strength & Muscle Tone: within normal limits Gait & Station: normal Patient leans: N/A  Psychiatric Specialty Exam: Physical Exam   Constitutional: She is oriented to person, place, and time. She appears well-developed and well-nourished.  HENT:  Head: Normocephalic and atraumatic.  Respiratory: Effort normal.  Neurological: She is alert and oriented to person, place, and time.  Psychiatric:  As above    ROS  Blood pressure 131/77, pulse 93, temperature 98.1 F (36.7 C), temperature source Oral, resp. rate 18, height 5' 4.5" (1.638 m), weight 79.4 kg (175 lb).Body mass index is  29.57 kg/m.  General Appearance: In bed just prior to interview, overweight, warmed up gradually. Does not appear internally distracted.   Eye Contact:  Good  Speech:  Normal rate and tone  Volume:  Normal  Mood:  Depressed  Affect:  Blunted and mood congruent  Thought Process:  Normal speed of thought. Linear and goal directed.   Orientation:  Full (Time, Place, and Person)  Thought Content:  Ruminations about her lost friendship. No delusional theme. No preoccupation with violent thoughts. No hallucination since she has been here.   Suicidal Thoughts:  No  Homicidal Thoughts:  No  Memory:  Immediate;   Good Recent;   Good Remote;   Good  Judgement:  Good  Insight:  Good  Psychomotor Activity:  Decreased  Concentration:  Concentration: Good and Attention Span: Good  Recall:  Good  Fund of Knowledge:  Good  Language:  Good  Akathisia:  Negative  Handed:    AIMS (if indicated):     Assets:  Communication Skills Desire for Improvement Financial Resources/Insurance Housing Physical Health Resilience Talents/Skills Transportation Vocational/Educational  ADL's:  Fair  Cognition:  WNL  Sleep:  Number of Hours: 6    Treatment Plan Summary: 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 She is not psychotic. She has insight and wants to get better. We have agreed to continue Lexapro at current dose. We discussed use of low dose Gabapentin to target  anxiety. She consented to treatment after we reviewed the risks and benefits.   Psychiatric: MDD Adjustment Disorder  Medical:  Psychosocial:  Recent end of a relationship.   PLAN: 1. Continue Lexapro at current dose 2. Trazodone for insomnia 3. Gabapentin 100 mg BID 4. Encourage unit groups and therapeutic activities 5. Monitor mood, behavior and interaction with peers 6. SW would gather collateral from her family and coordinate aftercare   Observation Level/Precautions:  15 minute checks  Laboratory:    Psychotherapy:    Medications:    Consultations:    Discharge Concerns:    Estimated LOS:  Other:     Physician Treatment Plan for Primary Diagnosis: <principal problem not specified> Long Term Goal(s): Improvement in symptoms so as ready for discharge  Short Term Goals: Ability to identify changes in lifestyle to reduce recurrence of condition will improve, Ability to verbalize feelings will improve, Ability to disclose and discuss suicidal ideas, Ability to demonstrate self-control will improve, Ability to identify and develop effective coping behaviors will improve, Ability to maintain clinical measurements within normal limits will improve and Compliance with prescribed medications will improve  Physician Treatment Plan for Secondary Diagnosis: Active Problems:   MDD (major depressive disorder), single episode, severe with psychosis ([place])  Long Term Goal(s): Improvement in symptoms so as ready for discharge  Short Term Goals: Ability to identify changes in lifestyle to reduce recurrence of condition will improve, Ability to verbalize feelings will improve, Ability to disclose and discuss suicidal ideas, Ability to demonstrate self-control will improve, Ability to identify and develop effective coping behaviors will improve, Ability to maintain clinical measurements within normal limits will improve and Compliance with prescribed medications will improve  I certify that  inpatient services furnished can reasonably be expected to improve the patient's condition.    [place], MD 2/28/201910:49 AM

## 2017-03-19 NOTE — BHH Group Notes (Signed)
LCSW Group Therapy Note 03/19/2017 12:44 PM  Type of Therapy/Topic: Group Therapy: Balance in Life  Participation Level: Active  Description of Group:  This group will address the concept of balance and how it feels and looks when one is unbalanced. Patients will be encouraged to process areas in their lives that are out of balance and identify reasons for remaining unbalanced. Facilitators will guide patients in utilizing problem-solving interventions to address and correct the stressor making their life unbalanced. Understanding and applying boundaries will be explored and addressed for obtaining and maintaining a balanced life. Patients will be encouraged to explore ways to assertively make their unbalanced needs known to significant others in their lives, using other group members and facilitator for support and feedback.  Therapeutic Goals: 1. Patient will identify two or more emotions or situations they have that consume much of in their lives. 2. Patient will identify signs/triggers that life has become out of balance:  3. Patient will identify two ways to set boundaries in order to achieve balance in their lives:  4. Patient will demonstrate ability to communicate their needs through discussion and/or role plays  Summary of Patient Progress:  Virginia Bennett was engaged throughout the group session. She participated and contributed to the group's discussion. Virginia Bennett states that balance is similar to emotional regulation. She states that the way people treat her causes her to become unbalanced. Virginia Bennett states that she plans to become more organized and will focus on herself more in order to maintain balance at discharge.    Therapeutic Modalities:  Cognitive Behavioral Therapy Solution-Focused Therapy Assertiveness Training   Alcario DroughtJolan Lamonica Trueba LCSWA Clinical Social Worker

## 2017-03-19 NOTE — Progress Notes (Signed)
Adult Psychoeducational Group Note  Date:  03/19/2017 Time:  10:53 AM  Group Topic/Focus:  Crisis Planning:   The purpose of this group is to help patients create a crisis plan for use upon discharge or in the future, as needed.  Participation Level:  Active  Participation Quality:  Appropriate  Affect:  Appropriate  Cognitive:  Alert  Insight: Improving  Engagement in Group:  Developing/Improving  Modes of Intervention:  Discussion and Problem-solving   [place] Caterrika C Violetta Lavalle 03/19/2017, 10:53 AM

## 2017-03-19 NOTE — Progress Notes (Signed)
Nursing Progress Note: 7p-7a D: Pt currently presents with a anxious/pleasant affect and behavior. Pt states "My day was a 5 out of 10, which is good for me. I cam in as 1 out of 10. I feel like being around everyone helps me to get my mind off of stuff." Interacting appropriately with the milieu. Pt reports good sleep during the previous night with current medication regimen. Pt did attend wrap-up group.  A: Pt provided with medications per providers orders. Pt's labs and vitals were monitored throughout the night. Pt supported emotionally and encouraged to express concerns and questions. Pt educated on medications.  R: Pt's safety ensured with 15 minute and environmental checks. Pt currently denies SI, HI, and AVH. Pt verbally contracts to seek staff if SI,HI, or AVH occurs and to consult with staff before acting on any harmful thoughts. Will continue to monitor.

## 2017-03-19 NOTE — Progress Notes (Signed)
Pt attend wrap up group. Her day was better than yesterday. She can truly smile today. This was her goal. She want to get to a good place in her life.

## 2017-03-19 NOTE — Progress Notes (Signed)
Pt presents with a flat affect and anxious mood. Pt expressed to writer that she's been having suicidal thoughts for a few days along with command AH. Pt denies any triggers or traumatic events that could have precipitated the SI or AH. Pt denies VH. Pt denies any active SI or AH today. Pt reported last endorsing AH yesterday. Pt stated that she's been taking Lexapro along with another medication for anxiety for a week. Writer reviewed meds and unit expectations with pt. Verbal support provided. Pt encouraged to attend groups. 15 minute checks performed for safety.

## 2017-03-20 NOTE — Progress Notes (Signed)
Recreation Therapy Notes  Date: 03/20/17 Time: 0930 Location: 300 Hall Dayroom  Group Topic: Stress Management  Goal Area(s) Addresses:  Patient will verbalize importance of using healthy stress management.  Patient will identify positive emotions associated with healthy stress management.   Intervention: Stress Management  Activity :  Body Scan.  LRT introduced the stress management technique meditation.  LRT played a body scan meditation to allow patients to examine any sensations or tension they may have been feeling.  Patients were to follow along as the meditation played.  Education:  Stress Management, Discharge Planning.   Education Outcome: Acknowledges edcuation/In group clarification offered/Needs additional education  Clinical Observations/Feedback: Pt did not attend group.    Zyshawn Bohnenkamp, LRT/CTRS         Arhianna Ebey A 03/20/2017 10:44 AM 

## 2017-03-20 NOTE — Progress Notes (Signed)
[facility]  03/20/2017 3:31 PM Emeline GinsMakenzie Lesser  MRN:  [id number] Subjective:   22y.o AAF, single, employed, lives with her family.No past history of mental illness. She was referred by her PCP and accompanied by her mother. Patient reports command auditory hallucination to end her own life. She attempted to drown self a couple of days ago. She attempted to slit own wrist on day of admission. Main stressoris recent break-up with her best friend. Routine labsare essentially normal. Toxicology is negative. UDS ispositive for THC and Benzo's.  Chart reviewed today. Patient discussed at team today.  Nursing staff reports that patient has been appropriate on the unit. Patient has been interacting well with peers. No behavioral issues. Patient has not voiced any suicidal thoughts. Patient has not been observed to be internally stimulated. Patient has been adherent with treatment recommendations. Patient has been tolerating their medication well.   Seen today. She is very bright. Tells me that she is feeling better. Says she is not longer ruminating on her past relationship. She slept well last night. She is eating good. Says her energy levels are back to normal. She is processing things quickly again. No suicidal thoughts. No homicidal thoughts. Encouraged.    Principal Problem: MDD (major depressive disorder), single episode, severe with psychosis (HCC) Diagnosis:   Patient Active Problem List   Diagnosis Date Noted  . Adjustment disorder, unspecified [F43.20] 03/19/2017  . MDD (major depressive disorder), single episode, severe with psychosis (HCC) [F32.3] 03/18/2017  . Surveillance of implantable subdermal contraceptive [Z30.46] 04/07/2014  . History of ADHD [Z86.59] 04/07/2014  . BMI (body mass index), pediatric, greater than or equal to 95% for age [facility][Z68.54] 03/01/2014  . Behavior causing concern in biological child [Z71.89, Z62.820] 03/01/2014  . Acne vulgaris [L70.0] 03/01/2014   . Slow transit constipation [K59.01] 03/01/2014   Total Time spent with patient: 20 minutes  Past Psychiatric History: As in H&P  Past Medical History:  Past Medical History:  Diagnosis Date  . ADHD (attention deficit hyperactivity disorder)   . Learning disability    history of IEP beginning in 5th grade  . Vitamin D deficiency disease    History reviewed. No pertinent surgical history. Family History:  Family History  Problem Relation Age of Onset  . Diabetes Father   . Asthma Sister   . Asthma Sister    Family Psychiatric  History: As in H&P Social History:  Social History   Substance and Sexual Activity  Alcohol Use No  . Alcohol/week: 0.0 oz     Social History   Substance and Sexual Activity  Drug Use Yes  . Types: Marijuana    Social History   Socioeconomic History  . Marital status: Single    Spouse name: None  . Number of children: None  . Years of education: None  . Highest education level: None  Social Needs  . Financial resource strain: None  . Food insecurity - worry: None  . Food insecurity - inability: None  . Transportation needs - medical: None  . Transportation needs - non-medical: None  Occupational History  . None  Tobacco Use  . Smoking status: Never Smoker  . Smokeless tobacco: Never Used  Substance and Sexual Activity  . Alcohol use: No    Alcohol/week: 0.0 oz  . Drug use: Yes    Types: Marijuana  . Sexual activity: No    Birth control/protection: Abstinence  Other Topics Concern  . None  Social History Narrative   Lives  at home mom, mom's spouse 3 siblings. No pets. No smokers. Mom works days as Public house manager. Stepfather is at home. May visit father in [place] in the summer.   Additional Social History:                         Sleep: Good  Appetite:  Good  Current Medications: Current Facility-Administered Medications  Medication Dose Route Frequency Provider Last Rate Last Dose  . acetaminophen (TYLENOL) tablet  650 mg  650 mg Oral Q6H PRN Laveda Abbe, NP      . alum & mag hydroxide-simeth (MAALOX/MYLANTA) 200-200-20 MG/5ML suspension 30 mL  30 mL Oral Q4H PRN Laveda Abbe, NP      . escitalopram (LEXAPRO) tablet 10 mg  10 mg Oral Daily Laveda Abbe, NP   10 mg at 03/20/17 1610  . gabapentin (NEURONTIN) capsule 100 mg  100 mg Oral BID Georgiann Cocker, MD   100 mg at 03/20/17 9604  . hydrOXYzine (ATARAX/VISTARIL) tablet 25 mg  25 mg Oral TID PRN Laveda Abbe, NP   25 mg at 03/19/17 2234  . magnesium hydroxide (MILK OF MAGNESIA) suspension 30 mL  30 mL Oral Daily PRN Laveda Abbe, NP      . pantoprazole (PROTONIX) EC tablet 40 mg  40 mg Oral QPM Donell Sievert E, PA-C   40 mg at 03/19/17 1700  . traZODone (DESYREL) tablet 50 mg  50 mg Oral QHS PRN Laveda Abbe, NP   50 mg at 03/19/17 2234    Lab Results: No results found for this or any previous visit (from the past 48 hour(s)).  Blood Alcohol level:  No results found for: [facility]  Metabolic Disorder Labs: Lab Results  Component Value Date   HGBA1C 4.9 06/20/2016   MPG 94 06/20/2016   MPG 105 03/01/2014   No results found for: PROLACTIN Lab Results  Component Value Date   CHOL 122 03/01/2014   TRIG 51 03/01/2014   HDL 37 03/01/2014   CHOLHDL 3.3 03/01/2014   VLDL 10 03/01/2014   LDLCALC 75 03/01/2014    Physical Findings: AIMS: Facial and Oral Movements Muscles of Facial Expression: None, normal Lips and Perioral Area: None, normal Jaw: None, normal Tongue: None, normal,Extremity Movements Upper (arms, wrists, hands, fingers): None, normal Lower (legs, knees, ankles, toes): None, normal, Trunk Movements Neck, shoulders, hips: None, normal, Overall Severity Severity of abnormal movements (highest score from questions above): None, normal Incapacitation due to abnormal movements: None, normal Patient's awareness of abnormal movements (rate only patient's report): No Awareness,  Dental Status Current problems with teeth and/or dentures?: No Does patient usually wear dentures?: No  CIWA:    COWS:     Musculoskeletal: Strength & Muscle Tone: within normal limits Gait & Station: normal Patient leans: N/A  Psychiatric Specialty Exam: Physical Exam  Constitutional: She is oriented to person, place, and time. She appears well-developed and well-nourished.  HENT:  Head: Normocephalic and atraumatic.  Respiratory: Effort normal.  Neurological: She is alert and oriented to person, place, and time.  Psychiatric:  As above     ROS  Blood pressure 135/70, pulse (!) 120, temperature 98.1 F (36.7 C), temperature source Oral, resp. rate 18, height 5' 4.5" (1.638 m), weight 79.4 kg (175 lb).Body mass index is 29.57 kg/m.  General Appearance: Out at the day room interacting with peers. Pleasant and engaged well. Appropriate behavior.   Eye Contact:  Good  Speech:  Clear and Coherent and Normal Rate  Volume:  Normal  Mood:  Feeling much better  Affect:  Appropriate and Full Range  Thought Process:  Linear  Orientation:  Full (Time, Place, and Person)  Thought Content:  Less rumination. No delusional theme. No preoccupation with violent thoughts. No obsession.  No hallucination in any modality.   Suicidal Thoughts:  None currently  Homicidal Thoughts:  No  Memory:  Immediate;   Good Recent;   Good Remote;   Good  Judgement:  Good  Insight:  Good  Psychomotor Activity:  Normal  Concentration:  Concentration: Good and Attention Span: Good  Recall:  Good  Fund of Knowledge:  Good  Language:  Good  Akathisia:  Negative  Handed:    AIMS (if indicated):     Assets:  Communication Skills Desire for Improvement Financial Resources/Insurance Housing Physical Health Resilience Social Support  ADL's:  Intact  Cognition:  WNL  Sleep:  Number of Hours: 6     Treatment Plan Summary: Patient's spirit is lifting. She is not pervasively depressed. She is no  longer having suicidal thoughts. She is tolerating her medication well. We plan to evaluate her further. Hopeful discharge in a couple of days.  Psychiatric: MDD Adjustment Disorder  Medical:  Psychosocial:  Recent end of a relationship.   PLAN: 1. Continue current regimen 2.  Continue to monitor mood, behavior and interaction with peers      Georgiann Cocker, MD 03/20/2017, 3:31 PM

## 2017-03-20 NOTE — [facility] Suicide Risk Assessment (Signed)
[facility] INPATIENT:  Family/Significant Other Suicide Prevention Education  Suicide Prevention Education:  Education Completed; Levin ErpKimberly Rastetter, mother, [phone], has been identified by the patient as the family member/significant other with whom the patient will be residing, and identified as the person(s) who will aid the patient in the event of a mental health crisis (suicidal ideations/suicide attempt).  With written consent from the patient, the family member/significant other has been provided the following suicide prevention education, prior to the and/or following the discharge of the patient.  The suicide prevention education provided includes the following:  Suicide risk factors  Suicide prevention and interventions  National Suicide Hotline telephone number  [facility] assessment telephone number  [facility] Emergency Assistance 911  [facility] and/or Residential Mobile Crisis Unit telephone number  Request made of family/significant other to:  Remove weapons (e.g., guns, rifles, knives), all items previously/currently identified as safety concern.  Selena BattenKim reports there are guns in the home, but they are locked.  Remove drugs/medications (over-the-counter, prescriptions, illicit drugs), all items previously/currently identified as a safety concern. Kim agreed to check the home for excess medications.  The family member/significant other verbalizes understanding of the suicide prevention education information provided.  The family member/significant other agrees to remove the items of safety concern listed above.  Kim reports pt obviously in crisis, but she thinks there is more going on than just the issue with the friend.  "She's been down for a while and masked it."  Pt has been somewhat fearful for a while.  Pt failed to graduate high school and things have not been going well over all.  Mother not sure what all this is about but would like pt to pursue  this more to find out.    Lorri FrederickWierda, Sharai Overbay Jon, LCSW 03/20/2017, 11:44 AM

## 2017-03-20 NOTE — Tx Team (Signed)
Interdisciplinary Treatment and Diagnostic Plan Update  03/20/2017 Time of Session: [place] MRN: [id number]  Principal Diagnosis: MDD (major depressive disorder), single episode, severe with psychosis ([place])  Secondary Diagnoses: Principal Problem:   MDD (major depressive disorder), single episode, severe with psychosis ([place]) Active Problems:   Adjustment disorder, unspecified   Current Medications:  Current Facility-Administered Medications  Medication Dose Route Frequency Provider Last Rate Last Dose  . acetaminophen (TYLENOL) tablet 650 mg  650 mg Oral Q6H PRN Ethelene Hal, NP      . alum & mag hydroxide-simeth (MAALOX/MYLANTA) 200-200-20 MG/5ML suspension 30 mL  30 mL Oral Q4H PRN Ethelene Hal, NP      . escitalopram (LEXAPRO) tablet 10 mg  10 mg Oral Daily Ethelene Hal, NP   10 mg at 03/20/17 6837  . gabapentin (NEURONTIN) capsule 100 mg  100 mg Oral BID Artist Beach, MD   100 mg at 03/20/17 2902  . hydrOXYzine (ATARAX/VISTARIL) tablet 25 mg  25 mg Oral TID PRN Ethelene Hal, NP   25 mg at 03/19/17 2234  . magnesium hydroxide (MILK OF MAGNESIA) suspension 30 mL  30 mL Oral Daily PRN Ethelene Hal, NP      . pantoprazole (PROTONIX) EC tablet 40 mg  40 mg Oral QPM Patriciaann Clan E, PA-C   40 mg at 03/19/17 1700  . traZODone (DESYREL) tablet 50 mg  50 mg Oral QHS PRN Ethelene Hal, NP   50 mg at 03/19/17 2234   PTA Medications: Medications Prior to Admission  Medication Sig Dispense Refill Last Dose  . escitalopram (LEXAPRO) 10 MG tablet Take 10 mg by mouth daily.   03/18/2017 at Unknown time  . etonogestrel (NEXPLANON) 68 MG IMPL implant 1 each by Subdermal route once. Placed 02/2016     . LORazepam (ATIVAN) 0.5 MG tablet Take 0.5 mg by mouth 3 (three) times daily as needed for anxiety.   03/18/2017 at Unknown time  . metroNIDAZOLE (FLAGYL) 500 MG tablet Take 1 tablet (500 mg total) by mouth 2 (two) times daily.  (Patient not taking: Reported on 03/18/2017) 14 tablet 0 Not Taking at Unknown time  . norethindrone-ethinyl estradiol (JUNEL FE 1/20) 1-20 MG-MCG tablet Take 1 tablet by mouth daily. (Patient not taking: Reported on 03/18/2017) 1 Package 11 Not Taking at Unknown time  . ranitidine (ZANTAC) 300 MG tablet Take 300 mg by mouth at bedtime.   03/17/2017 at Unknown time    Patient Stressors: Educational concerns Marital or family conflict Substance abuse  Patient Strengths: Ability for insight Average or above average intelligence Capable of independent living [employer] of knowledge Motivation for treatment/growth Supportive family/friends  Treatment Modalities: Medication Management, Group therapy, Case management,  1 to 1 session with clinician, Psychoeducation, Recreational therapy.   Physician Treatment Plan for Primary Diagnosis: MDD (major depressive disorder), single episode, severe with psychosis ([place]) Long Term Goal(s): Improvement in symptoms so as ready for discharge Improvement in symptoms so as ready for discharge   Short Term Goals: Ability to identify changes in lifestyle to reduce recurrence of condition will improve Ability to verbalize feelings will improve Ability to disclose and discuss suicidal ideas Ability to demonstrate self-control will improve Ability to identify and develop effective coping behaviors will improve Ability to maintain clinical measurements within normal limits will improve Compliance with prescribed medications will improve Ability to identify changes in lifestyle to reduce recurrence of condition will improve Ability to verbalize feelings will improve Ability to  disclose and discuss suicidal ideas Ability to demonstrate self-control will improve Ability to identify and develop effective coping behaviors will improve Ability to maintain clinical measurements within normal limits will improve Compliance with prescribed medications will  improve  Medication Management: Evaluate patient's response, side effects, and tolerance of medication regimen.  Therapeutic Interventions: 1 to 1 sessions, Unit Group sessions and Medication administration.  Evaluation of Outcomes: Not Met  Physician Treatment Plan for Secondary Diagnosis: Principal Problem:   MDD (major depressive disorder), single episode, severe with psychosis ([place]) Active Problems:   Adjustment disorder, unspecified  Long Term Goal(s): Improvement in symptoms so as ready for discharge Improvement in symptoms so as ready for discharge   Short Term Goals: Ability to identify changes in lifestyle to reduce recurrence of condition will improve Ability to verbalize feelings will improve Ability to disclose and discuss suicidal ideas Ability to demonstrate self-control will improve Ability to identify and develop effective coping behaviors will improve Ability to maintain clinical measurements within normal limits will improve Compliance with prescribed medications will improve Ability to identify changes in lifestyle to reduce recurrence of condition will improve Ability to verbalize feelings will improve Ability to disclose and discuss suicidal ideas Ability to demonstrate self-control will improve Ability to identify and develop effective coping behaviors will improve Ability to maintain clinical measurements within normal limits will improve Compliance with prescribed medications will improve     Medication Management: Evaluate patient's response, side effects, and tolerance of medication regimen.  Therapeutic Interventions: 1 to 1 sessions, Unit Group sessions and Medication administration.  Evaluation of Outcomes: Not Met   RN Treatment Plan for Primary Diagnosis: MDD (major depressive disorder), single episode, severe with psychosis ([place]) Long Term Goal(s): Knowledge of disease and therapeutic regimen to maintain health will improve  Short Term Goals:  Ability to identify and develop effective coping behaviors will improve and Compliance with prescribed medications will improve  Medication Management: RN will administer medications as ordered by provider, will assess and evaluate patient's response and provide education to patient for prescribed medication. RN will report any adverse and/or side effects to prescribing provider.  Therapeutic Interventions: 1 on 1 counseling sessions, Psychoeducation, Medication administration, Evaluate responses to treatment, Monitor vital signs and CBGs as ordered, Perform/monitor CIWA, COWS, AIMS and Fall Risk screenings as ordered, Perform wound care treatments as ordered.  Evaluation of Outcomes: Not Met   LCSW Treatment Plan for Primary Diagnosis: MDD (major depressive disorder), single episode, severe with psychosis ([place]) Long Term Goal(s): Safe transition to appropriate next level of care at discharge, Engage patient in therapeutic group addressing interpersonal concerns.  Short Term Goals: Engage patient in aftercare planning with referrals and resources, Increase social support and Increase skills for wellness and recovery  Therapeutic Interventions: Assess for all discharge needs, 1 to 1 time with Social worker, Explore available resources and support systems, Assess for adequacy in community support network, Educate family and significant other(s) on suicide prevention, Complete Psychosocial Assessment, Interpersonal group therapy.  Evaluation of Outcomes: Not Met   Progress in Treatment: Attending groups: Yes. Participating in groups: Yes. Taking medication as prescribed: Yes. Toleration medication: Yes. Family/Significant other contact made: No, will contact:  mother Patient understands diagnosis: Yes. Discussing patient identified problems/goals with staff: Yes. Medical problems stabilized or resolved: Yes. Denies suicidal/homicidal ideation: Yes. Issues/concerns per patient  self-inventory: No. Other: none  New problem(s) identified: No, Describe:  none  New Short Term/Long Term Goal(s):Pt goal: "be better than I was, make myself happy."  Discharge Plan or Barriers:   Reason for Continuation of Hospitalization: Depression Medication stabilization  Estimated Length of Stay: 3-5 days.  Attendees: Patient: Virginia Bennett 03/20/2017   Physician: Dr Sanjuana Letters, MD 03/20/2017   Nursing: Grayland Ormond, RN 03/20/2017   RN Care Manager: 03/20/2017   Social Worker: Lurline Idol, LCSW 03/20/2017   Recreational Therapist:  03/20/2017   Other:  03/20/2017   Other:  03/20/2017   Other: 03/20/2017        Scribe for Treatment Team: Joanne Chars, LCSW 03/20/2017 11:12 AM

## 2017-03-20 NOTE — Progress Notes (Signed)
Psychoeducational Group Note  Date:  03/20/2017 Time:  1010  Group Topic/Focus:  Goals Group:   The focus of this group is to help patients establish daily goals to achieve during treatment and discuss how the patient can incorporate goal setting into their daily lives to aide in recovery.  Participation Level: Did Not Attend  Participation Quality:  Not Applicable  Affect:  Not Applicable  Cognitive:  Not Applicable  Insight:  Not Applicable  Engagement in Group: Not Applicable  Additional Comments:  Pt was asleep and could not attend group this morning.  Jhovany Weidinger E 03/20/2017, 10:10 AM

## 2017-03-20 NOTE — Progress Notes (Signed)
D Patient has been observed OOB UAL on the unit today, tolerated fair. She makes brief eye contact. SHe is observed interacting with her peers on the 400 hall today...tolerated well. A She completed her daily assessment and on this she wrote she denied SI and she rated her depression, hopelessness and anxeity " 6/6/6/", respectively. R She requested ( and Clinical research associatewriter will speak with physician tomorrow ) to get rx for possible vaginal bacterialosis.

## 2017-03-20 NOTE — [facility] Group Notes (Signed)
  [facility]  Date/Time: 03/20/17, 1315  Type of Therapy/Topic:  Group Therapy:  Emotion Regulation  Participation Level:  Active   Mood:pleasant  Description of Group:    The purpose of this group is to assist patients in learning to regulate negative emotions and experience positive emotions. Patients will be guided to discuss ways in which they have been vulnerable to their negative emotions. These vulnerabilities will be juxtaposed with experiences of positive emotions or situations, and patients challenged to use positive emotions to combat negative ones. Special emphasis will be placed on coping with negative emotions in conflict situations, and patients will process healthy conflict resolution skills.  Therapeutic Goals: 1. Patient will identify two positive emotions or experiences to reflect on in order to balance out negative emotions:  2. Patient will label two or more emotions that they find the most difficult to experience:  3. Patient will be able to demonstrate positive conflict resolution skills through discussion or role plays:   Summary of Patient Progress:Pt shared that sadness is one emotion that pt finds difficult to experience.  Pt was active in discussion regarding positive ways to deal with difficult emotions and made a number of good comments throughout.       Therapeutic Modalities:   Cognitive Behavioral Therapy Feelings Identification Dialectical Behavioral Therapy  Daleen SquibbGreg Kai Railsback, LCSW

## 2017-03-20 NOTE — [facility] Group Notes (Signed)
Adult Psychoeducational Group Note  Date:  03/20/2017 Time:  10:08 PM  Group Topic/Focus:  Wrap-Up Group:   The focus of this group is to help patients review their daily goal of treatment and discuss progress on daily workbooks.  Participation Level:  Active  Participation Quality:  Appropriate and Attentive  Affect:  Appropriate  Cognitive:  Alert and Appropriate  Insight: Appropriate and Good  Engagement in Group:  Engaged  Modes of Intervention:  Discussion and Education  Additional Comments:  Pt attended and participated in wrap up group this evening. Pt rated their day a 7/10 because of the people that they have been around have good vibes and are making her smile. Pt goal was to be better than they were yesterday, and they are getting better everyday. A positive noted by the pt was that they are doing better and better at completing their goal.   Virginia NettersOctavia A Icelyn Bennett 03/20/2017, 10:08 PM

## 2017-03-20 NOTE — [facility] Group Notes (Signed)
[facility] Group Notes:  (Nursing/MHT/Case Management/Adjunct)  Date:  03/20/2017  Time:  4:00 p.m.  Type of Therapy:  Psychoeducational Skills  Participation Level:  Active  Participation Quality:  Appropriate  Affect:  Appropriate  Cognitive:  Appropriate  Insight:  Appropriate  Engagement in Group:  Engaged  Modes of Intervention:  Education  Summary of Progress/Problems:  Patient actively participated in educational group.    Earline MayotteKnight, Dannya Pitkin Shephard 03/20/2017, 5:42 PM

## 2017-03-20 NOTE — Progress Notes (Signed)
Patient ID: Virginia Bennett, female   DOB: 1995/11/13, 22 y.o.   MRN: [id number]  Pt currently presents with a masked affect and cooperative behavior. Pt reports to writer that their goal is to "have a better day than today." Pt remains tachycardic tonight, reports concerns about anxiety. Pt reports good sleep with current medication regimen.   Pt provided with medications per providers orders. Pt's labs and vitals were monitored throughout the night. Pt given a 1:1 about emotional and mental status. Pt supported and encouraged to express concerns and questions. Pt educated on medications and deep breathing as a relaxation technique, return demonstrated technique appropriately.   Pt's safety ensured with 15 minute and environmental checks. Pt currently denies SI/HI and A/V hallucinations. Pt verbally agrees to seek staff if SI/HI or A/VH occurs and to consult with staff before acting on any harmful thoughts. Will continue POC.

## 2017-03-21 MED ORDER — FLUCONAZOLE 150 MG PO TABS
150.0000 mg | ORAL_TABLET | Freq: Once | ORAL | Status: DC
  Filled 2017-03-21: qty 1

## 2017-03-21 MED ORDER — FLUCONAZOLE 100 MG PO TABS
150.0000 mg | ORAL_TABLET | Freq: Once | ORAL | Status: AC
  Administered 2017-03-21: 150 mg via ORAL
  Filled 2017-03-21: qty 2
  Filled 2017-03-21: qty 1.5

## 2017-03-21 NOTE — Progress Notes (Addendum)
Patient has been up and active on the unit, attended group this evening and has voiced no complaints. She reports having had a good day and her goal is to be better than yesterday. Patient currently denies having pain, hi/a/v hall. She reports passive si and verbally contracted with Clinical research associatewriter to seek staff if having thoughts to harm herself. Support and encouragement offered, safety maintained on unit, will continue to monitor.

## 2017-03-21 NOTE — [facility] Group Notes (Signed)
Orientation / Goals   Date:  03/21/2017  Time:  9:39 AM  Type of Therapy:  Nurse Education  /  The group focuses on teaching patients who their staff is and what the staf responsibilities are as well as educating them about the unit programming / scheduling. Additionally, they are introduced to SMART goals-setting.  Participation Level:  Did Not Attend  Participation Quality:    Affect:    Cognitive:    Insight:    Engagement in Group:    Modes of Intervention:    Summary of Progress/Problems:  Rich BraveDuke, Jakai Onofre Lynn 03/21/2017, 9:39 AM

## 2017-03-21 NOTE — Progress Notes (Signed)
Adult Psychoeducational Group Note  Date:  03/21/2017 Time:  9:34 PM  Group Topic/Focus:  Wrap-Up Group:   The focus of this group is to help patients review their daily goal of treatment and discuss progress on daily workbooks.  Participation Level:  Active  Participation Quality:  Appropriate  Affect:  Appropriate  Cognitive:  Alert and Oriented  Insight: Good  Engagement in Group:  Engaged  Modes of Intervention:  Discussion  Additional Comments:  Pt stated her goal today was to be better than yesterday. Pt stated she achieved this goal and rated her day a 8.5/10. Pt stated her goal is the same tomorrow.   Leo GrosserMegan A Augustina Braddock 03/21/2017, 9:34 PM

## 2017-03-21 NOTE — Progress Notes (Addendum)
D Patient has been observed OOB UAL on the 400 hall today. She is pleasant. She smiels at wirter and others easily. She interacts with her peers appropriately. A SHe completed her daily assessment and on this she wrote she denied SI today and she rated her depression, hopelessness and anxeity " 1/1/3", respectively. She shared that she wants to get her CNA, go to nursing school and obtain her RN. R Safety is in place .  Pt given 150 mg diflucan for c/o vaginal symptoms returning ( she reports taking a recent course of flagyl and says symptoms " came back" . Symptoms are itching, "cheesy smelling discharge and pain".

## 2017-03-21 NOTE — [facility] Group Notes (Signed)
LCSW Group Therapy Note  03/21/2017 10:00AM - 11:15 - 300 & [address] Halls, 11:15-12:00 - 500 Hall Hall  Type of Therapy and Topic:  Group Therapy: Anger Cues and Responses  Participation Level:  Active   Description of Group:   In this group, patients learned how to recognize the physical, cognitive, emotional, and behavioral responses they have to anger-provoking situations.  They identified a recent time they became angry and how they reacted.  They analyzed how their reaction was possibly beneficial and how it was possibly unhelpful.  The group discussed a variety of healthier coping skills that could help with such a situation in the future.  Deep breathing was practiced briefly.  Therapeutic Goals: 1. Patients will remember their last incident of anger and how they felt emotionally and physically, what their thoughts were at the time, and how they behaved. 2. Patients will identify how their behavior at that time worked for them, as well as how it worked against them. 3. Patients will explore possible new behaviors to use in future anger situations. 4. Patients will learn that anger itself is normal and cannot be eliminated, and that healthier reactions can assist with resolving conflict rather than worsening situations.  Summary of Patient Progress:  The patient shared that their most recent time of anger was something she does not remember because she does not get angry, just "laughs it off."  Later she changed that statement and said she does not remember getting angry because she blacks out.  Therapeutic Modalities:   Cognitive Behavioral Therapy  Virginia Bennett  03/21/2017 9:06 AM

## 2017-03-21 NOTE — Progress Notes (Signed)
[facility]  03/21/2017 4:03 PM Virginia GinsMakenzie Blystone  MRN:  [id number] Subjective:   21y.o AAF, single, employed, lives with her family.No past history of mental illness. She was referred by her PCP and accompanied by her mother. Patient reports command auditory hallucination to end her own life. She attempted to drown self a couple of days ago. She attempted to slit own wrist on day of admission. Main stressoris recent break-up with her best friend. Routine labsare essentially normal. Toxicology is negative. UDS ispositive for THC and Benzo's.  Chart reviewed today. Patient discussed at team today.  Nursing staff reports she has been doing well. No behavioral issues. She has been tolerating her medications well. Normal sleep wake cycle.   Seen today. Feels good. Says she is feeling normal again. She has been in contact with her family. Her mother is visiting her today. No suicidal thoughts lately. She enjoys to color. Encouraged.    Principal Problem: MDD (major depressive disorder), single episode, severe with psychosis (HCC) Diagnosis:   Patient Active Problem List   Diagnosis Date Noted  . Adjustment disorder, unspecified [F43.20] 03/19/2017  . MDD (major depressive disorder), single episode, severe with psychosis (HCC) [F32.3] 03/18/2017  . Surveillance of implantable subdermal contraceptive [Z30.46] 04/07/2014  . History of ADHD [Z86.59] 04/07/2014  . BMI (body mass index), pediatric, greater than or equal to 95% for age [facility]Z68.54] 03/01/2014  . Behavior causing concern in biological child [Z71.89, Z62.820] 03/01/2014  . Acne vulgaris [L70.0] 03/01/2014  . Slow transit constipation [K59.01] 03/01/2014   Total Time spent with patient: 20 minutes  Past Psychiatric History: As in H&P  Past Medical History:  Past Medical History:  Diagnosis Date  . ADHD (attention deficit hyperactivity disorder)   . Learning disability    history of IEP beginning in 5th grade  . Vitamin  D deficiency disease    History reviewed. No pertinent surgical history. Family History:  Family History  Problem Relation Age of Onset  . Diabetes Father   . Asthma Sister   . Asthma Sister    Family Psychiatric  History: As in H&P Social History:  Social History   Substance and Sexual Activity  Alcohol Use No  . Alcohol/week: 0.0 oz     Social History   Substance and Sexual Activity  Drug Use Yes  . Types: Marijuana    Social History   Socioeconomic History  . Marital status: Single    Spouse name: None  . Number of children: None  . Years of education: None  . Highest education level: None  Social Needs  . Financial resource strain: None  . Food insecurity - worry: None  . Food insecurity - inability: None  . Transportation needs - medical: None  . Transportation needs - non-medical: None  Occupational History  . None  Tobacco Use  . Smoking status: Never Smoker  . Smokeless tobacco: Never Used  Substance and Sexual Activity  . Alcohol use: No    Alcohol/week: 0.0 oz  . Drug use: Yes    Types: Marijuana  . Sexual activity: No    Birth control/protection: Abstinence  Other Topics Concern  . None  Social History Narrative   Lives at home mom, mom's spouse 3 siblings. No pets. No smokers. Mom works days as Public house managerLPN. Stepfather is at home. [place] in [place].   Additional Social History:  Sleep: Good  Appetite:  Good  Current Medications: Current Facility-Administered Medications  Medication Dose Route Frequency Provider Last Rate Last Dose  . acetaminophen (TYLENOL) tablet 650 mg  650 mg Oral Q6H PRN Laveda Abbe, NP      . alum & mag hydroxide-simeth (MAALOX/MYLANTA) 200-200-20 MG/5ML suspension 30 mL  30 mL Oral Q4H PRN Laveda Abbe, NP      . escitalopram (LEXAPRO) tablet 10 mg  10 mg Oral Daily Laveda Abbe, NP   10 mg at 03/21/17 1119  . fluconazole (DIFLUCAN) tablet  150 mg  150 mg Oral Once Oneta Rack, NP      . gabapentin (NEURONTIN) capsule 100 mg  100 mg Oral BID Izediuno, Delight Ovens, MD   100 mg at 03/21/17 1121  . hydrOXYzine (ATARAX/VISTARIL) tablet 25 mg  25 mg Oral TID PRN Laveda Abbe, NP   25 mg at 03/20/17 2136  . magnesium hydroxide (MILK OF MAGNESIA) suspension 30 mL  30 mL Oral Daily PRN Laveda Abbe, NP      . pantoprazole (PROTONIX) EC tablet 40 mg  40 mg Oral QPM Donell Sievert E, PA-C   40 mg at 03/20/17 1836  . traZODone (DESYREL) tablet 50 mg  50 mg Oral QHS PRN Laveda Abbe, NP   50 mg at 03/20/17 2136    Lab Results: No results found for this or any previous visit (from the past 48 hour(s)).  Blood Alcohol level:  No results found for: [facility]  Metabolic Disorder Labs: Lab Results  Component Value Date   HGBA1C 4.9 06/20/2016   MPG 94 06/20/2016   MPG 105 03/01/2014   No results found for: PROLACTIN Lab Results  Component Value Date   CHOL 122 03/01/2014   TRIG 51 03/01/2014   HDL 37 03/01/2014   CHOLHDL 3.3 03/01/2014   VLDL 10 03/01/2014   LDLCALC 75 03/01/2014    Physical Findings: AIMS: Facial and Oral Movements Muscles of Facial Expression: None, normal Lips and Perioral Area: None, normal Jaw: None, normal Tongue: None, normal,Extremity Movements Upper (arms, wrists, hands, fingers): None, normal Lower (legs, knees, ankles, toes): None, normal, Trunk Movements Neck, shoulders, hips: None, normal, Overall Severity Severity of abnormal movements (highest score from questions above): None, normal Incapacitation due to abnormal movements: None, normal Patient's awareness of abnormal movements (rate only patient's report): No Awareness, Dental Status Current problems with teeth and/or dentures?: No Does patient usually wear dentures?: No  CIWA:    COWS:     Musculoskeletal: Strength & Muscle Tone: within normal limits Gait & Station: normal Patient leans: N/A  Psychiatric  Specialty Exam: Physical Exam  Constitutional: She is oriented to person, place, and time. She appears well-developed and well-nourished.  HENT:  Head: Normocephalic and atraumatic.  Respiratory: Effort normal.  Neurological: She is alert and oriented to person, place, and time.  Psychiatric:  As above     ROS  Blood pressure 132/85, pulse 86, temperature 98.4 F (36.9 C), temperature source Oral, resp. rate 16, height 5' 4.5" (1.638 m), weight 79.4 kg (175 lb).Body mass index is 29.57 kg/m.  General Appearance: Neatly dressed, polite,  pleasant and engaged well. Appropriate behavior.   Eye Contact:  Good  Speech:  Clear and Coherent and Normal Rate  Volume:  Normal  Mood:  Euthymic  Affect:  Appropriate and Full Range  Thought Process:  Linear  Orientation:  Full (Time, Place, and Person)  Thought Content:  No negative rumination.  No delusional theme. No preoccupation with violent thoughts. No obsession.  No hallucination in any modality.   Suicidal Thoughts:  None currently  Homicidal Thoughts:  No  Memory:  Immediate;   Good Recent;   Good Remote;   Good  Judgement:  Good  Insight:  Good  Psychomotor Activity:  Normal  Concentration:  Concentration: Good and Attention Span: Good  Recall:  Good  Fund of Knowledge:  Good  Language:  Good  Akathisia:  Negative  Handed:    AIMS (if indicated):     Assets:  Communication Skills Desire for Improvement Financial Resources/Insurance Housing Physical Health Resilience Social Support  ADL's:  Intact  Cognition:  WNL  Sleep:  Number of Hours: 6.25     Treatment Plan Summary: Patient is responding to treatment. Improvement in mood has been sustained. No dangerousness. We plan to evaluate her further. Hopeful discharge in a day or two.  Psychiatric: MDD Adjustment Disorder  Medical:  Psychosocial:  Recent end of a relationship.   PLAN: 1. Continue current regimen 2. Continue to monitor mood, behavior and  interaction with peers      Georgiann Cocker, MD 03/21/2017, 4:03 PMPatient ID: Virginia Bennett, female   DOB: Jun 13, 1995, 22 y.o.   MRN: [id number]

## 2017-03-22 NOTE — Progress Notes (Signed)
[facility] [place] MD Progress Note  03/22/2017 1:18 PM Virginia Bennett  MRN:  [id number] Subjective:   21y.o AAF, single, employed, lives with her family.No past history of mental illness. She was referred by her PCP and accompanied by her mother. Patient reports command auditory hallucination to end her own life. She attempted to drown self a couple of days ago. She attempted to slit own wrist on day of admission. Main stressoris recent break-up with her best friend. Routine labsare essentially normal. Toxicology is negative. UDS ispositive for THC and Benzo's.  Chart reviewed today. Patient discussed at team today.  Nursing staff reports she has been doing well. No behavioral issues. She has been tolerating her medications well. Normal sleep wake cycle.   Seen today. Says she has maintained progress made. Her depression has lifted. She is feeling her normal self again. Her mother came to visit yesterday. Says her mother has observed the improvement too. No suicidal thoughts. No homicidal thoughts. No thoughts of violence. No side effects from her medications.   Principal Problem: MDD (major depressive disorder), single episode, severe with psychosis (HCC) Diagnosis:   Patient Active Problem List   Diagnosis Date Noted  . Adjustment disorder, unspecified [F43.20] 03/19/2017  . MDD (major depressive disorder), single episode, severe with psychosis (HCC) [F32.3] 03/18/2017  . Surveillance of implantable subdermal contraceptive [Z30.46] 04/07/2014  . History of ADHD [Z86.59] 04/07/2014  . BMI (body mass index), pediatric, greater than or equal to 95% for age [facility] 03/01/2014  . Behavior causing concern in biological child [Z71.89, Z62.820] 03/01/2014  . Acne vulgaris [L70.0] 03/01/2014  . Slow transit constipation [K59.01] 03/01/2014   Total Time spent with patient: 20 minutes  Past Psychiatric History: As in H&P  Past Medical History:  Past Medical History:  Diagnosis Date  . ADHD  (attention deficit hyperactivity disorder)   . Learning disability    history of IEP beginning in 5th grade  . Vitamin D deficiency disease    History reviewed. No pertinent surgical history. Family History:  Family History  Problem Relation Age of Onset  . Diabetes Father   . Asthma Sister   . Asthma Sister    Family Psychiatric  History: As in H&P Social History:  Social History   Substance and Sexual Activity  Alcohol Use No  . Alcohol/week: 0.0 oz     Social History   Substance and Sexual Activity  Drug Use Yes  . Types: Marijuana    Social History   Socioeconomic History  . Marital status: Single    Spouse name: None  . Number of children: None  . Years of education: None  . Highest education level: None  Social Needs  . Financial resource strain: None  . Food insecurity - worry: None  . Food insecurity - inability: None  . Transportation needs - medical: None  . Transportation needs - non-medical: None  Occupational History  . None  Tobacco Use  . Smoking status: Never Smoker  . Smokeless tobacco: Never Used  Substance and Sexual Activity  . Alcohol use: No    Alcohol/week: 0.0 oz  . Drug use: Yes    Types: Marijuana  . Sexual activity: No    Birth control/protection: Abstinence  Other Topics Concern  . None  Social History Narrative   Lives at home mom, mom's spouse 3 siblings. No pets. No smokers. Mom works days as Public house manager. Stepfather is at home. May visit father in [place] in the summer.   Additional Social History:  Sleep: Good  Appetite:  Good  Current Medications: Current Facility-Administered Medications  Medication Dose Route Frequency Provider Last Rate Last Dose  . acetaminophen (TYLENOL) tablet 650 mg  650 mg Oral Q6H PRN Laveda Abbe, NP      . alum & mag hydroxide-simeth (MAALOX/MYLANTA) 200-200-20 MG/5ML suspension 30 mL  30 mL Oral Q4H PRN Laveda Abbe, NP      . escitalopram  (LEXAPRO) tablet 10 mg  10 mg Oral Daily Laveda Abbe, NP   10 mg at 03/22/17 1149  . gabapentin (NEURONTIN) capsule 100 mg  100 mg Oral BID Izediuno, Delight Ovens, MD   100 mg at 03/22/17 1151  . hydrOXYzine (ATARAX/VISTARIL) tablet 25 mg  25 mg Oral TID PRN Laveda Abbe, NP   25 mg at 03/21/17 2147  . magnesium hydroxide (MILK OF MAGNESIA) suspension 30 mL  30 mL Oral Daily PRN Laveda Abbe, NP      . pantoprazole (PROTONIX) EC tablet 40 mg  40 mg Oral QPM Donell Sievert E, PA-C   40 mg at 03/21/17 1624  . traZODone (DESYREL) tablet 50 mg  50 mg Oral QHS PRN Laveda Abbe, NP   50 mg at 03/21/17 2147    Lab Results: No results found for this or any previous visit (from the past 48 hour(s)).  Blood Alcohol level:  No results found for: [facility]  Metabolic Disorder Labs: Lab Results  Component Value Date   HGBA1C 4.9 06/20/2016   MPG 94 06/20/2016   MPG 105 03/01/2014   No results found for: PROLACTIN Lab Results  Component Value Date   CHOL 122 03/01/2014   TRIG 51 03/01/2014   HDL 37 03/01/2014   CHOLHDL 3.3 03/01/2014   VLDL 10 03/01/2014   LDLCALC 75 03/01/2014    Physical Findings: AIMS: Facial and Oral Movements Muscles of Facial Expression: None, normal Lips and Perioral Area: None, normal Jaw: None, normal Tongue: None, normal,Extremity Movements Upper (arms, wrists, hands, fingers): None, normal Lower (legs, knees, ankles, toes): None, normal, Trunk Movements Neck, shoulders, hips: None, normal, Overall Severity Severity of abnormal movements (highest score from questions above): None, normal Incapacitation due to abnormal movements: None, normal Patient's awareness of abnormal movements (rate only patient's report): No Awareness, Dental Status Current problems with teeth and/or dentures?: No Does patient usually wear dentures?: No  CIWA:    COWS:     Musculoskeletal: Strength & Muscle Tone: within normal limits Gait & Station:  normal Patient leans: N/A  Psychiatric Specialty Exam: Physical Exam  Constitutional: She is oriented to person, place, and time. She appears well-developed and well-nourished.  HENT:  Head: Normocephalic and atraumatic.  Respiratory: Effort normal.  Neurological: She is alert and oriented to person, place, and time.  Psychiatric:  As above     ROS  Blood pressure 132/85, pulse 86, temperature 98.4 F (36.9 C), temperature source Oral, resp. rate 16, height 5' 4.5" (1.638 m), weight 79.4 kg (175 lb).Body mass index is 29.57 kg/m.  General Appearance: Neatly dressed, pleasant, engaging well and cooperative. Appropriate behavior. Not in any distress. Good relatedness. Not internally stimulated  Eye Contact:  Good  Speech:  Clear and Coherent and Normal Rate  Volume:  Normal  Mood:  Euthymic  Affect:  Appropriate and Full Range  Thought Process:  Linear  Orientation:  Full (Time, Place, and Person)  Thought Content:  No negative rumination. No delusional theme. No preoccupation with violent thoughts. No obsession.  No hallucination  in any modality.   Suicidal Thoughts:  None currently  Homicidal Thoughts:  No  Memory:  Immediate;   Good Recent;   Good Remote;   Good  Judgement:  Good  Insight:  Good  Psychomotor Activity:  Normal  Concentration:  Concentration: Good and Attention Span: Good  Recall:  Good  Fund of Knowledge:  Good  Language:  Good  Akathisia:  Negative  Handed:    AIMS (if indicated):     Assets:  Communication Skills Desire for Improvement Financial Resources/Insurance Housing Physical Health Resilience Social Support  ADL's:  Intact  Cognition:  WNL  Sleep:  Number of Hours: 6.25     Treatment Plan Summary: Depression has lifted. Patient is back to her baseline level of functions. She is not a danger to herself or others. We would evaluate her overnight. Hopeful discharge tomorrow.   Psychiatric: MDD Adjustment  Disorder  Medical:  Psychosocial:  Recent end of a relationship.   PLAN: 1. Continue current regimen 2. Continue to monitor mood, behavior and interaction with peers      Georgiann CockerVincent A Izediuno, MD 03/22/2017, 1:18 PMPatient ID: Virginia Bennett, female   DOB: [birth year], 22 y.o.   MRN: [id number] Patient ID: Virginia Bennett, female   DOB: [birth year], 22 y.o.   MRN: [id number]

## 2017-03-22 NOTE — [facility] Group Notes (Signed)
Orientation / Goals   Date:  03/22/2017  Time:  9:20 AM  Type of Therapy:  Nurse Education  /  The group is focused on orienting patients to who their staff is, what the staff's responsibilities are and what the unit programming / scheduling looks like. SMART goal-setting is introduced.  Participation Level:  Active  Participation Quality:  Attentive  Affect:  Appropriate  Cognitive:  Appropriate  Insight:  Appropriate  Engagement in Group:  Engaged  Modes of Intervention:  Education  Summary of Progress/Problems:  Rich BraveDuke, Cipriano Millikan Lynn 03/22/2017, 9:20 AM

## 2017-03-22 NOTE — [facility] Group Notes (Signed)
Identifying Needs   Date:  03/22/2017  Time:  7:43 AM  Type of Therapy:  Nurse Education  /  The group is focused on teaching patients how to identify their needs and ways to communicate these needs in a healthy fashion- in order to get them met.  Participation Level:  Active  Participation Quality:  Attentive  Affect:  Appropriate  Cognitive:  Appropriate  Insight:  Good  Engagement in Group:  Engaged  Modes of Intervention:  Education  Summary of Progress/Problems:  ,  Lynn 03/22/2017, 7:43 AM 

## 2017-03-22 NOTE — [facility] Group Notes (Signed)
[facility] LCSW Group Therapy Note  Date/Time:  03/22/2017 10:00-11:00AM  Type of Therapy and Topic:  Group Therapy:  Healthy and Unhealthy Supports  Participation Level:  Minimal   Description of Group:  Patients in this group were introduced to the idea of adding a variety of healthy supports to address the various needs in their lives.Patients discussed what additional healthy supports could be helpful in their recovery and wellness after discharge in order to prevent future hospitalizations.   An emphasis was placed on using counselor, doctor, therapy groups, 12-step groups, and problem-specific support groups to expand supports.  They also worked as a group on developing a specific plan for several patients to deal with unhealthy supports through boundary-setting, psychoeducation with loved ones, and even termination of relationships.   Therapeutic Goals:   1)  discuss importance of adding supports to stay well once out of the hospital  2)  compare healthy versus unhealthy supports and identify some examples of each  3)  generate ideas and descriptions of healthy supports that can be added  4)  offer mutual support about how to address unhealthy supports  5)  encourage active participation in and adherence to discharge plan    Summary of Patient Progress:  The patient was not present until the last 10 minutes of group while we were listening to some inspirational music pieces.  She was involved at that point.   Therapeutic Modalities:   Motivational Interviewing Brief Solution-Focused Therapy  Ambrose MantleMareida Grossman-Orr, LCSW

## 2017-03-22 NOTE — Progress Notes (Signed)
Patient has been up and active on the unit and  attended group. She reports feeling a little anxious about discharging on tomorrow but is ready. Patient currently denies having pain, -si/hi/a/v hall. Support and encouragement offered, safety maintained on unit, will continue to monitor.

## 2017-03-23 MED ORDER — HYDROXYZINE HCL 25 MG PO TABS: 25.0000 mg | ORAL_TABLET | Freq: Three times a day (TID) | ORAL | 0 refills | Status: DC | PRN

## 2017-03-23 MED ORDER — PANTOPRAZOLE SODIUM 40 MG PO TBEC: 40.0000 mg | DELAYED_RELEASE_TABLET | Freq: Every evening | ORAL | 0 refills | Status: DC

## 2017-03-23 MED ORDER — GABAPENTIN 100 MG PO CAPS: 100.0000 mg | ORAL_CAPSULE | Freq: Two times a day (BID) | ORAL | 0 refills | Status: DC

## 2017-03-23 MED ORDER — ESCITALOPRAM OXALATE 10 MG PO TABS: 10.0000 mg | ORAL_TABLET | Freq: Every day | ORAL | 0 refills | Status: DC

## 2017-03-23 MED ORDER — TRAZODONE HCL 50 MG PO TABS: 50.0000 mg | ORAL_TABLET | Freq: Every evening | ORAL | 0 refills | Status: DC | PRN

## 2017-03-23 NOTE — [facility] Group Notes (Signed)
[facility] Group Notes:  (Nursing/MHT/Case Management/Adjunct)  Date:  03/23/2017  Time:  1030  Type of Therapy:  Nurse Education - Self Care  Participation Level:  Active  Participation Quality:  Attentive  Affect:  Appropriate  Cognitive:  Alert  Insight:  Improving  Engagement in Group:  Engaged  Modes of Intervention:  Discussion, Education and Support  Summary of Progress/Problems: Patient attended group and was attentive. Identified interpersonal groups as an area on which to work.  Merian CapronFriedman, Makynli Stills [facility] 03/23/2017, 11:45 AM

## 2017-03-23 NOTE — Progress Notes (Signed)
Recreation Therapy Notes  Date: 03/23/17 Time: 0930 Location: 300 [address] Dayroom  Group Topic: Stress Management  Goal Area(s) Addresses:  Patient will verbalize importance of using healthy stress management.  Patient will identify positive emotions associated with healthy stress management.   Intervention: Stress Management  Activity :  Guided Imagery.  LRT introduced the stress management technique of guided imagery.  LRT read a script that allowed patients to envision being on the beach.  Patients were to follow along as the script was read to engage in the activity.  Education: Stress Management, Discharge Planning.   Education Outcome: Acknowledges edcuation/In group clarification offered/Needs additional education  Clinical Observations/Feedback: Pt did not attend group.     Auriella Wieand, LRT/CTRS          Nyashia Raney A 03/23/2017 12:07 PM 

## 2017-03-23 NOTE — Progress Notes (Signed)
Patient ID: Virginia Bennett, female   DOB: 04/23/1995, 22 y.o.   MRN: [id number]  Discharge Note- Belongings returned to patient at time of discharge. Patient denies any pain or discomfort. Discharge instructions and medications were reviewed with patient. Patient verbalized understanding of both medications and discharge instructions. Patient's mother, with patient's verbal permission, was also given discharge information. Patient discharged to lobby with mom. Q15 minute safety checks maintained until time of discharge. No distress upon discharge.

## 2017-03-23 NOTE — Progress Notes (Signed)
Patient ID: Virginia Bennett, female   DOB: [birth year], 22 y.o.   MRN: [id number]  DAR: Pt. Denies SI/HI and A/V Hallucinations. She reports that her sleep last night was fair, her appetite is fair, her energy level is normal, and her concentration is good. She rates her depression level 1/10, her hopelessness level is 0/10, and her anxiety level is 4/10. Patient does not report any pain or discomfort at this time. Support and encouragement provided to the patient. Scheduled medications administered to patient per pPatient is receptive and cooperative. She reports that she feels ready for discharge soon. Q15 minute checks are maintained for safety.

## 2017-03-23 NOTE — Discharge Summary (Signed)
Physician Discharge Summary Note  Patient:  Virginia Bennett is an 22 y.o., female MRN:  [id number] DOB:  [birth year] Patient phone:  [phone] (home)  Patient address:   [address] [place] [address],  Total Time spent with patient: 20 minutes  Date of Admission:  03/18/2017 Date of Discharge: 03/23/17  Reason for Admission:  Worsening depression with SI  Principal Problem: MDD (major depressive disorder), single episode, severe with psychosis [facility]) Discharge Diagnoses: Patient Active Problem List   Diagnosis Date Noted  . Adjustment disorder, unspecified [F43.20] 03/19/2017  . MDD (major depressive disorder), single episode, severe with psychosis (HCC) [F32.3] 03/18/2017  . Surveillance of implantable subdermal contraceptive [Z30.46] 04/07/2014  . History of ADHD [Z86.59] 04/07/2014  . BMI (body mass index), pediatric, greater than or equal to 95% for age [facility] 03/01/2014  . Behavior causing concern in biological child [Z71.89, Z62.820] 03/01/2014  . Acne vulgaris [L70.0] 03/01/2014  . Slow transit constipation [K59.01] 03/01/2014    Past Psychiatric History: Long history of subclinical depression. Never been on medication until recently. She has been tolerating Lexapro well. She was also prescribed low dose benzodiazepine during the acute phase.  No past history of mania. No past history of psychosis. No past history of self mutilation. No past history of suicidal behavior. No past history of violent behavior. She has never had any inpatient treatment in the past. No past psychological or physical treatment  Past Medical History:  Past Medical History:  Diagnosis Date  . ADHD (attention deficit hyperactivity disorder)   . Learning disability    history of IEP beginning in 5th grade  . Vitamin D deficiency disease    History reviewed. No pertinent surgical history. Family History:  Family History  Problem Relation Age of Onset  . Diabetes Father   .  Asthma Sister   . Asthma Sister    Family Psychiatric  History: Strong family history of MDD and anxiety disorder. No family history of suicide.   Social History:  Social History   Substance and Sexual Activity  Alcohol Use No  . Alcohol/week: 0.0 oz     Social History   Substance and Sexual Activity  Drug Use Yes  . Types: Marijuana    Social History   Socioeconomic History  . Marital status: Single    Spouse name: None  . Number of children: None  . Years of education: None  . Highest education level: None  Social Needs  . Financial resource strain: None  . Food insecurity - worry: None  . Food insecurity - inability: None  . Transportation needs - medical: None  . Transportation needs - non-medical: None  Occupational History  . None  Tobacco Use  . Smoking status: Never Smoker  . Smokeless tobacco: Never Used  Substance and Sexual Activity  . Alcohol use: No    Alcohol/week: 0.0 oz  . Drug use: Yes    Types: Marijuana  . Sexual activity: No    Birth control/protection: Abstinence  Other Topics Concern  . None  Social History Narrative   Lives at home mom, mom's spouse 3 siblings. No pets. No smokers. Mom works days as Public house manager. Stepfather is at home. May visit father in [place] in the summer.    Hospital Course:   03/19/17 [facility] MD Assessment: 22 y.o AAF, single , employed, lives with her family. No past history of mental illness. She was referred by her PCP and accompanied by her mother. Patient reports command auditory hallucination  to end her own life. She attempted to drown self a couple of days ago. She attempted to slit own wrist on day of admission. Main stressor is recent break-up with her best friend. Routine labs are essentially normal. Toxicology is negative. UDS is positive for THC and Benzo's. At interview, patient reports that she has a strong family history of depression and anxiety disorder. Says she has been depressed for a long time. Says she had  coped without any medications. Ten days ago, her best friend decided to end their relationship. Says they have been friends for over three years. Says he was someone she shared everything with. Patient says she has become very down since then. She has not been eating well. She has lost eight pounds. She has been finding it difficult to get into sleep. Has multiple awakening and early morning awakening. Patient says six days ago, she started hearing a voice in her head. She is not show if this is her conscience. It has been mainly negative themes and urge to end her own life. Says she has never had any abnormal perception until recently. No hallucination in any other modality. No command to hurt others. Patient says she had been using THC since she was 22 years of age. She decided to give it up a couple of days ago. Says she has become more anxious lately. She has become really withdrawn. Her PCP recently started her on Lexapro.  Denies use of any other substance. No synthetic substance use. No evidence of mania. No access to weapons.   Patient remained on the [facility] unit for 4 days and stabilized with medication and therapy. Patient was started on Lexapro 10 mg Daily, Vistaril 25 mg TID PRN,. Gabapentin 100 mg BID, and Trazodone 50 mg QHS. Patient shows improvement with improved mood, affect, sleep, appetite, and interaction. Patient is seen in the day room interacting with peers and staff appropriately. Patient has been attending groups and participating. Patient denies any SI/HI/AVH and contracts for safety. She agrees to follow up at [facility]. Patient is provided with prescriptions for her medications upon discharge.    Physical Findings: AIMS: Facial and Oral Movements Muscles of Facial Expression: None, normal Lips and Perioral Area: None, normal Jaw: None, normal Tongue: None, normal,Extremity Movements Upper (arms, wrists, hands, fingers): None, normal Lower (legs, knees, ankles, toes):  None, normal, Trunk Movements Neck, shoulders, hips: None, normal, Overall Severity Severity of abnormal movements (highest score from questions above): None, normal Incapacitation due to abnormal movements: None, normal Patient's awareness of abnormal movements (rate only patient's report): No Awareness, Dental Status Current problems with teeth and/or dentures?: No Does patient usually wear dentures?: No  CIWA:    COWS:     Musculoskeletal: Strength & Muscle Tone: within normal limits Gait & Station: normal Patient leans: N/A  Psychiatric Specialty Exam: Physical Exam  Nursing note and vitals reviewed. Constitutional: She is oriented to person, place, and time. She appears well-developed and well-nourished.  Cardiovascular: Normal rate.  Respiratory: Effort normal.  Musculoskeletal: Normal range of motion.  Neurological: She is alert and oriented to person, place, and time.  Skin: Skin is warm.    Review of Systems  Constitutional: Negative.   HENT: Negative.   Eyes: Negative.   Respiratory: Negative.   Cardiovascular: Negative.   Genitourinary: Negative.   Musculoskeletal: Negative.   Skin: Negative.   Neurological: Negative.   Endo/Heme/Allergies: Negative.   Psychiatric/Behavioral: Negative.     Blood pressure 114/69, pulse  88, temperature 98.2 F (36.8 C), temperature source Oral, resp. rate 16, height 5' 4.5" (1.638 m), weight 79.4 kg (175 lb).Body mass index is 29.57 kg/m.  General Appearance: Casual  Eye Contact:  Good  Speech:  Clear and Coherent and Normal Rate  Volume:  Normal  Mood:  Euthymic  Affect:  Congruent  Thought Process:  Goal Directed and Descriptions of Associations: Intact  Orientation:  Full (Time, Place, and Person)  Thought Content:  WDL  Suicidal Thoughts:  No  Homicidal Thoughts:  No  Memory:  Immediate;   Good Recent;   Good Remote;   Good  Judgement:  Good  Insight:  Good  Psychomotor Activity:  Normal  Concentration:   Concentration: Good and Attention Span: Good  Recall:  Good  Fund of Knowledge:  Good  Language:  Good  Akathisia:  No  Handed:  Right  AIMS (if indicated):     Assets:  Communication Skills Desire for Improvement Financial Resources/Insurance Housing Physical Health Social Support Transportation  ADL's:  Intact  Cognition:  WNL  Sleep:  Number of Hours: 6.25     Have you used any form of tobacco in the last 30 days? (Cigarettes, Smokeless Tobacco, Cigars, and/or Pipes): No  Has this patient used any form of tobacco in the last 30 days? (Cigarettes, Smokeless Tobacco, Cigars, and/or Pipes) Yes, No  Blood Alcohol level:  No results found for: [facility]  Metabolic Disorder Labs:  Lab Results  Component Value Date   HGBA1C 4.9 06/20/2016   MPG 94 06/20/2016   MPG 105 03/01/2014   No results found for: PROLACTIN Lab Results  Component Value Date   CHOL 122 03/01/2014   TRIG 51 03/01/2014   HDL 37 03/01/2014   CHOLHDL 3.3 03/01/2014   VLDL 10 03/01/2014   LDLCALC 75 03/01/2014    See Psychiatric Specialty Exam and Suicide Risk Assessment completed by Attending Physician prior to discharge.  Discharge destination:  Home  Is patient on multiple antipsychotic therapies at discharge:  No   Has Patient had three or more failed trials of antipsychotic monotherapy by history:  No  Recommended Plan for Multiple Antipsychotic Therapies: NA   Allergies as of 03/23/2017      Reactions   Aspirin    Father & Siblings are allergic. So Mom doesn't even give to patient.      Medication List    STOP taking these medications   etonogestrel 68 MG Impl implant Commonly known as:  NEXPLANON   LORazepam 0.5 MG tablet Commonly known as:  ATIVAN   metroNIDAZOLE 500 MG tablet Commonly known as:  FLAGYL   norethindrone-ethinyl estradiol 1-20 MG-MCG tablet Commonly known as:  JUNEL FE 1/20   ranitidine 300 MG tablet Commonly known as:  ZANTAC     TAKE these medications      Indication  escitalopram 10 MG tablet Commonly known as:  LEXAPRO Take 1 tablet (10 mg total) by mouth daily. For mood control Start taking on:  03/24/2017 What changed:  additional instructions  Indication:  mood stability   gabapentin 100 MG capsule Commonly known as:  NEURONTIN Take 1 capsule (100 mg total) by mouth 2 (two) times daily. Withdrawal symptoms  Indication:  Abuse or Misuse of Alcohol, Alcohol Withdrawal Syndrome   hydrOXYzine 25 MG tablet Commonly known as:  ATARAX/VISTARIL Take 1 tablet (25 mg total) by mouth 3 (three) times daily as needed for anxiety.  Indication:  Feeling Anxious   pantoprazole 40 MG tablet Commonly  known as:  PROTONIX Take 1 tablet (40 mg total) by mouth every evening. For acid reflux  Indication:  Gastroesophageal Reflux Disease   traZODone 50 MG tablet Commonly known as:  DESYREL Take 1 tablet (50 mg total) by mouth at bedtime as needed for sleep.  Indication:  Trouble Sleeping        Follow-up recommendations:  Continue activity as tolerated. Continue diet as recommended by your PCP. Ensure to keep all appointments with outpatient providers.  Comments:  Patient is instructed prior to discharge to: Take all medications as prescribed by his/her mental healthcare provider. Report any adverse effects and or reactions from the medicines to his/her outpatient provider promptly. Patient has been instructed & cautioned: To not engage in alcohol and or illegal drug use while on prescription medicines. In the event of worsening symptoms, patient is instructed to call the crisis hotline, 911 and or go to the nearest ED for appropriate evaluation and treatment of symptoms. To follow-up with his/her primary care provider for your other medical issues, concerns and or health care needs.    Signed: Gerlene Burdock Maressa Apollo, FNP 03/23/2017, 12:59 PM

## 2017-03-23 NOTE — Tx Team (Signed)
Interdisciplinary Treatment and Diagnostic Plan Update  03/23/2017 Time of Session: 0920 Virginia Bennett MRN: [id number]  Principal Diagnosis: MDD (major depressive disorder), single episode, severe with psychosis (HCC)  Secondary Diagnoses: Principal Problem:   MDD (major depressive disorder), single episode, severe with psychosis (HCC) Active Problems:   Adjustment disorder, unspecified   Current Medications:  Current Facility-Administered Medications  Medication Dose Route Frequency Provider Last Rate Last Dose  . acetaminophen (TYLENOL) tablet 650 mg  650 mg Oral Q6H PRN Laveda Abbe, NP      . alum & mag hydroxide-simeth (MAALOX/MYLANTA) 200-200-20 MG/5ML suspension 30 mL  30 mL Oral Q4H PRN Laveda Abbe, NP      . escitalopram (LEXAPRO) tablet 10 mg  10 mg Oral Daily Laveda Abbe, NP   10 mg at 03/23/17 0900  . gabapentin (NEURONTIN) capsule 100 mg  100 mg Oral BID Izediuno, Vincent A, MD   100 mg at 03/23/17 0900  . hydrOXYzine (ATARAX/VISTARIL) tablet 25 mg  25 mg Oral TID PRN Laveda Abbe, NP   25 mg at 03/22/17 2124  . magnesium hydroxide (MILK OF MAGNESIA) suspension 30 mL  30 mL Oral Daily PRN Laveda Abbe, NP      . pantoprazole (PROTONIX) EC tablet 40 mg  40 mg Oral QPM Donell Sievert E, PA-C   40 mg at 03/22/17 1819  . traZODone (DESYREL) tablet 50 mg  50 mg Oral QHS PRN Laveda Abbe, NP   50 mg at 03/22/17 2124   PTA Medications: Medications Prior to Admission  Medication Sig Dispense Refill Last Dose  . escitalopram (LEXAPRO) 10 MG tablet Take 10 mg by mouth daily.   03/18/2017 at Unknown time  . etonogestrel (NEXPLANON) 68 MG IMPL implant 1 each by Subdermal route once. Placed 02/2016     . LORazepam (ATIVAN) 0.5 MG tablet Take 0.5 mg by mouth 3 (three) times daily as needed for anxiety.   03/18/2017 at Unknown time  . metroNIDAZOLE (FLAGYL) 500 MG tablet Take 1 tablet (500 mg total) by mouth 2 (two) times daily.  (Patient not taking: Reported on 03/18/2017) 14 tablet 0 Not Taking at Unknown time  . norethindrone-ethinyl estradiol (JUNEL FE 1/20) 1-20 MG-MCG tablet Take 1 tablet by mouth daily. (Patient not taking: Reported on 03/18/2017) 1 Package 11 Not Taking at Unknown time  . ranitidine (ZANTAC) 300 MG tablet Take 300 mg by mouth at bedtime.   03/17/2017 at Unknown time    Patient Stressors: Educational concerns Marital or family conflict Substance abuse  Patient Strengths: Ability for insight Average or above average intelligence Capable of independent living [employer] of knowledge Motivation for treatment/growth Supportive family/friends  Treatment Modalities: Medication Management, Group therapy, Case management,  1 to 1 session with clinician, Psychoeducation, Recreational therapy.   Physician Treatment Plan for Primary Diagnosis: MDD (major depressive disorder), single episode, severe with psychosis (HCC) Long Term Goal(s): Improvement in symptoms so as ready for discharge Improvement in symptoms so as ready for discharge   Short Term Goals: Ability to identify changes in lifestyle to reduce recurrence of condition will improve Ability to verbalize feelings will improve Ability to disclose and discuss suicidal ideas Ability to demonstrate self-control will improve Ability to identify and develop effective coping behaviors will improve Ability to maintain clinical measurements within normal limits will improve Compliance with prescribed medications will improve Ability to identify changes in lifestyle to reduce recurrence of condition will improve Ability to verbalize feelings will improve Ability to  disclose and discuss suicidal ideas Ability to demonstrate self-control will improve Ability to identify and develop effective coping behaviors will improve Ability to maintain clinical measurements within normal limits will improve Compliance with prescribed medications will  improve  Medication Management: Evaluate patient's response, side effects, and tolerance of medication regimen.  Therapeutic Interventions: 1 to 1 sessions, Unit Group sessions and Medication administration.  Evaluation of Outcomes: Adequate for Discharge  Physician Treatment Plan for Secondary Diagnosis: Principal Problem:   MDD (major depressive disorder), single episode, severe with psychosis (HCC) Active Problems:   Adjustment disorder, unspecified  Long Term Goal(s): Improvement in symptoms so as ready for discharge Improvement in symptoms so as ready for discharge   Short Term Goals: Ability to identify changes in lifestyle to reduce recurrence of condition will improve Ability to verbalize feelings will improve Ability to disclose and discuss suicidal ideas Ability to demonstrate self-control will improve Ability to identify and develop effective coping behaviors will improve Ability to maintain clinical measurements within normal limits will improve Compliance with prescribed medications will improve Ability to identify changes in lifestyle to reduce recurrence of condition will improve Ability to verbalize feelings will improve Ability to disclose and discuss suicidal ideas Ability to demonstrate self-control will improve Ability to identify and develop effective coping behaviors will improve Ability to maintain clinical measurements within normal limits will improve Compliance with prescribed medications will improve     Medication Management: Evaluate patient's response, side effects, and tolerance of medication regimen.  Therapeutic Interventions: 1 to 1 sessions, Unit Group sessions and Medication administration.  Evaluation of Outcomes: Adequate for Discharge   RN Treatment Plan for Primary Diagnosis: MDD (major depressive disorder), single episode, severe with psychosis (HCC) Long Term Goal(s): Knowledge of disease and therapeutic regimen to maintain health will  improve  Short Term Goals: Ability to identify and develop effective coping behaviors will improve and Compliance with prescribed medications will improve  Medication Management: RN will administer medications as ordered by provider, will assess and evaluate patient's response and provide education to patient for prescribed medication. RN will report any adverse and/or side effects to prescribing provider.  Therapeutic Interventions: 1 on 1 counseling sessions, Psychoeducation, Medication administration, Evaluate responses to treatment, Monitor vital signs and CBGs as ordered, Perform/monitor CIWA, COWS, AIMS and Fall Risk screenings as ordered, Perform wound care treatments as ordered.  Evaluation of Outcomes: Adequate for Discharge   LCSW Treatment Plan for Primary Diagnosis: MDD (major depressive disorder), single episode, severe with psychosis (HCC) Long Term Goal(s): Safe transition to appropriate next level of care at discharge, Engage patient in therapeutic group addressing interpersonal concerns.  Short Term Goals: Engage patient in aftercare planning with referrals and resources, Increase social support and Increase skills for wellness and recovery  Therapeutic Interventions: Assess for all discharge needs, 1 to 1 time with Social worker, Explore available resources and support systems, Assess for adequacy in community support network, Educate family and significant other(s) on suicide prevention, Complete Psychosocial Assessment, Interpersonal group therapy.  Evaluation of Outcomes: Adequate for Discharge   Progress in Treatment: Attending groups: Yes. Participating in groups: Yes. Taking medication as prescribed: Yes. Toleration medication: Yes. Family/Significant other contact made: No, will contact:  mother Patient understands diagnosis: Yes. Discussing patient identified problems/goals with staff: Yes. Medical problems stabilized or resolved: Yes. Denies suicidal/homicidal  ideation: Yes. Issues/concerns per patient self-inventory: No. Other: none  New problem(s) identified: No, Describe:  none  New Short Term/Long Term Goal(s):Pt goal: "be better than I was,  make myself happy."  Discharge Plan or Barriers: Return home with her mother and follow up with [facility] program for medication management and therapy services.   Reason for Continuation of Hospitalization: NONE  Estimated Length of Stay: Discharging Monday, 03/23/17  Attendees: Patient: Virginia Bennett 03/20/2017   Physician: Dr Jackquline BerlinIzediuno, MD 03/20/2017   Nursing: Quintella ReichertBeverly Knight, RN 03/20/2017   RN Care Manager: 03/20/2017   Social Worker: Daleen SquibbGreg Wierda, LCSW 03/20/2017   Recreational Therapist:  03/20/2017   Other:  03/20/2017   Other:  03/20/2017   Other: 03/20/2017        Scribe for Treatment Team: Maeola SarahJolan E Kanan Sobek, LCSWA 03/23/2017 12:03 PM

## 2017-03-23 NOTE — BHH Suicide Risk Assessment (Signed)
[facility] Discharge Suicide Risk Assessment   Principal Problem: MDD (major depressive disorder), single episode, severe with psychosis [facility]) Discharge Diagnoses:  Patient Active Problem List   Diagnosis Date Noted  . Adjustment disorder, unspecified [F43.20] 03/19/2017  . MDD (major depressive disorder), single episode, severe with psychosis (HCC) [F32.3] 03/18/2017  . Surveillance of implantable subdermal contraceptive [Z30.46] 04/07/2014  . History of ADHD [Z86.59] 04/07/2014  . BMI (body mass index), pediatric, greater than or equal to 95% for age [facility] 03/01/2014  . Behavior causing concern in biological child [Z71.89, Z62.820] 03/01/2014  . Acne vulgaris [L70.0] 03/01/2014  . Slow transit constipation [K59.01] 03/01/2014    Total Time spent with patient: 45 minutes  Musculoskeletal: Strength & Muscle Tone: within normal limits Gait & Station: normal Patient leans: N/A  Psychiatric Specialty Exam: Review of Systems  Constitutional: Negative.   HENT: Negative.   Eyes: Negative.   Respiratory: Negative.   Cardiovascular: Negative.   Gastrointestinal: Negative.   Genitourinary: Negative.   Musculoskeletal: Negative.   Skin: Negative.   Neurological: Negative.   Endo/Heme/Allergies: Negative.   Psychiatric/Behavioral: Negative for depression, hallucinations, memory loss, substance abuse and suicidal ideas. The patient is not nervous/anxious and does not have insomnia.     Blood pressure 114/69, pulse 88, temperature 98.2 F (36.8 C), temperature source Oral, resp. rate 16, height 5' 4.5" (1.638 m), weight 79.4 kg (175 lb).Body mass index is 29.57 kg/m.  General Appearance: Neatly dressed, pleasant, engaging well and cooperative. Appropriate behavior. Not in any distress. Good relatedness. Not internally stimulated.  Eye Contact::  Good  Speech:  Spontaneous, normal prosody. Normal tone and rate.   Volume:  Normal  Mood:  Euthymic  Affect:  Appropriate and Full Range   Thought Process:  Linear  Orientation:  Full (Time, Place, and Person)  Thought Content:  Future oriented. No delusional theme. No preoccupation with violent thoughts. No negative ruminations. No obsession.  No hallucination in any modality.   Suicidal Thoughts:  No  Homicidal Thoughts:  No  Memory:  Immediate;   Good Recent;   Good Remote;   Good  Judgement:  Good  Insight:  Good  Psychomotor Activity:  Normal  Concentration:  Good  Recall:  Good  Fund of Knowledge:Good  Language: Good  Akathisia:  Negative  Handed:    AIMS (if indicated):     Assets:  Communication Skills Desire for Improvement Financial Resources/Insurance Housing Physical Health Resilience Social Support Talents/Skills Transportation Vocational/Educational  Sleep:  Number of Hours: 6.25  Cognition: WNL  ADL's:  Intact   Clinical Assessment::   22y.o AAF, single, employed, lives with her family.No past history of mental illness. She was referred by her PCP and accompanied by her mother. Patient reports command auditory hallucination to end her own life. She attempted to drown self a couple of days ago. She attempted to slit own wrist on day of admission. Main stressoris recent break-up with her best friend. Routine labsare essentially normal. Toxicology is negative. UDS ispositive for THC and Benzo's.  Seen today. Reports that she is in good spirits. Not feeling depressed. Reports normal energy and interest. Has been maintaining normal biological functions. She is able to think clearly. She is able to focus on task. Her thoughts are not crowded or racing. No evidence of mania. No hallucination in any modality. She is not making any delusional statement. No passivity of will/thought. She is fully in touch with reality. No thoughts of suicide. No thoughts of homicide. No violent thoughts.  No overwhelming anxiety. No access to weapons. No new stressors. No craving for substances.   Nursing staff reports  that patient has been appropriate on the unit. Patient has been interacting well with peers. No behavioral issues. Patient has not voiced any suicidal thoughts. Patient has not been observed to be internally stimulated. Patient has been adherent with treatment recommendations. Patient has been tolerating their medication well.  \ Patient was discussed at team. Team members feels that patient is back to her baseline level of function. Team agrees with plan to discharge patient today.   Demographic Factors:  NA  Loss Factors: Loss of significant relationship  Historical Factors: Impulsivity  Risk Reduction Factors:   Sense of responsibility to family, Employed, Living with another person, especially a relative, Positive social support, Positive therapeutic relationship and Positive coping skills or problem solving skills  Continued Clinical Symptoms:  As above  Cognitive Features That Contribute To Risk:  None    Suicide Risk:  Minimal: No identifiable suicidal ideation. Patient is not having any thoughts of suicide at this time. Modifiable risk factors targeted during this admission includes depression, adjustment disorder and substance use. Demographical and historical risk factors cannot be modified. Patient is now engaging well. Patient is reliable and is future oriented. We have buffered patient's support structures. At this point, patient is at low risk of suicide. Patient is aware of the effects of psychoactive substances on decision making process. Patient has been provided with emergency contacts. Patient acknowledges to use resources provided if unforseen circumstances changes their current risk stratification.      Plan Of Care/Follow-up recommendations:  1. Continue current psychotropic medications 2. Mental health and addiction follow up as arranged.  3. Discharge in care of her family 4. Provided limited quantity of prescriptions   Georgiann CockerVincent A Izediuno, MD 03/23/2017,  10:06 AM

## 2017-03-23 NOTE — Progress Notes (Signed)
  [facility]  Will you be returning to the same living situation after discharge:  Yes,  with her mother  At discharge, do you have transportation home?: Yes,  patient will discharge with her mother Do you have the ability to pay for your medications: Yes,  [employer]  Release of information consent forms completed and in the chart;  Patient's signature needed at discharge.  Patient to Follow up at: Follow-up Information    BEHAVIORAL HEALTH PARTIAL HOSPITALIZATION PROGRAM Follow up.   Specialty:  Behavioral Health Why:  Appointment is 03/25/17 at 1:30pm Contact information: [address] [id number] [place] [id number] [phone]          Next level of care provider [facility] Link:yes  Safety Planning and Suicide Prevention discussed: Yes,  with the patient's mother  Have you used any form of tobacco in the last 30 days? (Cigarettes, Smokeless Tobacco, Cigars, and/or Pipes): No  Has patient been referred to the Quitline?: N/A patient is not a smoker  Patient has been referred for addiction treatment: N/A  Maeola SarahJolan E Adir Schicker, LCSWA 03/23/2017, 2:32 PM

## 2017-03-25 ENCOUNTER — Other Ambulatory Visit (HOSPITAL_COMMUNITY): Payer: 59 | Attending: Psychiatry | Admitting: Licensed Clinical Social Worker

## 2017-03-25 DIAGNOSIS — K219 Gastro-esophageal reflux disease without esophagitis: Secondary | ICD-10-CM | POA: Insufficient documentation

## 2017-03-25 DIAGNOSIS — F909 Attention-deficit hyperactivity disorder, unspecified type: Secondary | ICD-10-CM | POA: Diagnosis not present

## 2017-03-25 DIAGNOSIS — F419 Anxiety disorder, unspecified: Secondary | ICD-10-CM | POA: Diagnosis not present

## 2017-03-25 DIAGNOSIS — E559 Vitamin D deficiency, unspecified: Secondary | ICD-10-CM | POA: Diagnosis not present

## 2017-03-25 DIAGNOSIS — R45851 Suicidal ideations: Secondary | ICD-10-CM | POA: Diagnosis not present

## 2017-03-25 DIAGNOSIS — F333 Major depressive disorder, recurrent, severe with psychotic symptoms: Secondary | ICD-10-CM | POA: Diagnosis not present

## 2017-03-25 DIAGNOSIS — F323 Major depressive disorder, single episode, severe with psychotic features: Secondary | ICD-10-CM

## 2017-03-25 DIAGNOSIS — Z79899 Other long term (current) drug therapy: Secondary | ICD-10-CM | POA: Diagnosis not present

## 2017-03-25 DIAGNOSIS — F8189 Other developmental disorders of scholastic skills: Secondary | ICD-10-CM | POA: Insufficient documentation

## 2017-03-25 DIAGNOSIS — F4320 Adjustment disorder, unspecified: Secondary | ICD-10-CM

## 2017-03-26 ENCOUNTER — Other Ambulatory Visit (HOSPITAL_COMMUNITY): Payer: 59 | Admitting: Specialist

## 2017-03-26 ENCOUNTER — Encounter (HOSPITAL_COMMUNITY): Payer: Self-pay

## 2017-03-26 ENCOUNTER — Other Ambulatory Visit (HOSPITAL_COMMUNITY): Payer: 59 | Admitting: Licensed Clinical Social Worker

## 2017-03-26 VITALS — BP 118/80 | HR 77 | Ht 65.0 in | Wt 181.0 lb

## 2017-03-26 DIAGNOSIS — R4589 Other symptoms and signs involving emotional state: Secondary | ICD-10-CM

## 2017-03-26 DIAGNOSIS — F333 Major depressive disorder, recurrent, severe with psychotic symptoms: Secondary | ICD-10-CM | POA: Diagnosis not present

## 2017-03-26 DIAGNOSIS — F323 Major depressive disorder, single episode, severe with psychotic features: Secondary | ICD-10-CM

## 2017-03-26 MED ORDER — QUETIAPINE FUMARATE 100 MG PO TABS: 100.0000 mg | ORAL_TABLET | Freq: Every day | ORAL | 0 refills | Status: DC

## 2017-03-26 MED ORDER — CLONAZEPAM 0.5 MG PO TABS: 0.5000 mg | ORAL_TABLET | Freq: Two times a day (BID) | ORAL | 0 refills | Status: DC
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
## 2017-03-26 NOTE — Progress Notes (Signed)
Later met with patient who had began to cry with Loistine Chance as patient reported she was more anxious, admitted she has been hearing voices since the previous night but just did not want to tell anyone.  Patient stated the voices were telling her to take a pen and to stab herself with it but denied any plan or intent to follow through with this thought but was just scared.  Patient reported she had called her Mother who was on the way to our office and that she has a pack with her not to harm herself and she intends to keep this.  Patient then met with Dr. Daron Offer with Loistine Chance, counselor and this nurse and later with her Mother included as well as he requested we discontinue patient's Trazodone and Lexapro, to start patient on Seroquel 100 mg at bedtime and Clonazepam 0.5 mg, one twice a day.  Dr. Daron Offer signed and reviewed Clonazepam order and gave this to patient with her Mother to fill and e-scribed in a 90 day supply of the new Seroquel order to patient's [facility] on [facility] per Dr. Joycelyn Schmid verbal order for 90 days.  Patient and her Mother agreed to pick this up today to start tonight and reviewed all other changes with Dr. Daron Offer.  Patient to inform us on tomorrow if changes effective and how she does with sleep tonight due to only sleeping 3-4 hours the previous night.  Patient to call as needed or her Mother and they will contact us or bring her in on an emergency basis if any worsening of symptoms occurs this date. Patient to return to [facility] tomorrow and left with Mother for today.

## 2017-03-26 NOTE — Progress Notes (Signed)
Patient presents with flat affect, depressed mood and admitted she still has some positive auditory hallucinations at times.  States this was [facility] and that they say negative things at times too.  Denies any current auditory or visual hallucinations and no command hallucinations.  Reports no current suicidal or homicidal ideations and no intent or plan to harm self or others.  Patient rated her current level of depression a 10, anxiety an 8 and hopelessness an 8 on a scale of 0-10 with 0 being none and 10 the worst she could manage. States she is taking her medication [facility] but is still having some problems with sleep and questions if she may need an increase in antidepressant.  States current medications have helped but still experiencing some symptoms and often takes her several hours to go to sleep and then only sleeping 3-4 hours sound per night.  Patient reported her appetite was improving and discussed times and how she is currently taking PRN hydroxyzine as reported only using once a day now and in the mornings.  Denies causes her to be drowsy but she reported it "relaxes me".  Patient reported plan to try hydroxyzine at night followed by her Trazodone later to see if this would also help her relax and to go to sleep easier at night.  Patient agreed to see nurse practitioner on tomorrow to report if this helps and to discuss concern she may need an increase in Lexapro as she requested.  Patient denied any current suicidal ideations or homicidal ideations and scored a 23 on her PHQ9 depression screening today for the past 2 weeks.  Patient reported she like PHP group so far but admitted she is a little shy and does not like to speak up too much.  States she is fine with talking and sharing some but also understands she will not be expected to do more than comfortable but to participate.  Patient reported she is stable at this time and will  see NP on tomorrow as denies any other problems or side effects to medications at this time. Patient admitted to + marijuana use 3 times a day currently and discussed this as something not recommended due to symptoms.

## 2017-03-27 ENCOUNTER — Other Ambulatory Visit (HOSPITAL_COMMUNITY): Payer: 59 | Admitting: Licensed Clinical Social Worker

## 2017-03-27 ENCOUNTER — Other Ambulatory Visit: Payer: Self-pay

## 2017-03-27 DIAGNOSIS — F333 Major depressive disorder, recurrent, severe with psychotic symptoms: Secondary | ICD-10-CM | POA: Diagnosis not present

## 2017-03-27 DIAGNOSIS — F339 Major depressive disorder, recurrent, unspecified: Secondary | ICD-10-CM | POA: Insufficient documentation

## 2017-03-27 HISTORY — DX: Major depressive disorder, recurrent, unspecified: F33.9

## 2017-03-27 NOTE — Psych (Signed)
   [facility]  Virginia Bennett Zia [id number]  Session Time: 9:00 - 10:30  Participation Level: Minimal  Behavioral Response: CasualAlertDepressed  Type of Therapy: Group Therapy  Treatment Goals addressed: Coping  Interventions: CBT, DBT, Solution Focused, Supportive and Reframing  Summary: Clinician led check-in regarding current stressors and situation, and review of patient completed daily inventory. Clinician utilized active listening and empathetic response and validated patient emotions. Clinician facilitated processing group on pertinent issues.    Therapist Response: Virginia Bennett Luecke is a 22 y.o. female who presents with depression symptoms.  Patient arrived within time allowed and reports that she is feeling "whatever." Patient rates her mood at a 5 on a scale of 1-10 with 10 being great. Pt reports he yesterday was "pretty bad" and shares she got her new medication and went to sleep. Pt reports 6 hours of sleep up from an average of 3 which she is happy about.       Session Time: 10:30 - 11:15   Participation Level: Minimal   Behavioral Response: CasualAlertDepressed   Type of Therapy: Group Therapy, Psychotherapy   Treatment Goals addressed: Coping   Interventions: CBT, Solution focused, Supportive, Reframing   Summary: Cln led group on hopelessness and group worked together to reframe and conceptualize hopelessness in a healthy way.    Therapist Response: Patient engaged in group. Pt was closed off through the conversation and declined to share.         Session Time: 11:15 - 12:00   Participation Level: Active   Behavioral Response: CasualAlertDepressed   Type of Therapy: Group Therapy, Psychotherapy; Psychoeducation   Treatment Goals addressed: Coping   Interventions: CBT, Solution focused, Supportive, Reframing   Summary: Cln continued topic of communication. Cln introduced "I" Statements and how to formulate them as well  as common pitfalls and how to avoid them.    Therapist Response: Patient engaged in group. Pt reports understanding of "I" Statements and successfully formulated her own in practice.         Session Time: 12:00- 1:00  Participation Level: Active  Behavioral Response: CasualAlertDepressed  Type of Therapy: Group Therapy, Psychoeducation; Psychotherapy  Treatment Goals addressed: Coping  Interventions: CBT; Solution focused; Supportive; Reframing  Summary: 12:00 - 12:50: Clinician led assertiveness workshop in which group members shared issues with assertiveness and other group members helped problem solve the concern and respond in an assertive way.  12:50 -1:00 Clinician led check-out. Clinician assessed for immediate needs, medication compliance and efficacy, and safety concerns   Therapist Response: Patient engaged in activity and discussion. Pt reports difficulty not becoming aggressive in her communications.  At check-out, patient rates her mood at a 6 on a scale of 1-10 with 10 being great. Patient reports plans of doing laundry. Patient demonstrates some progress as evidenced by opening up as group progressed. Pt was talkative and engaged by end of group. Patient denies SI/HI/self-harm at the end of group.     Suicidal/Homicidal: Nowithout intent/plan  Plan: Pt will continue in PHP with medication management while continuing to address managing psychosis, decreasing depression symptoms, and increasing ability to manage symptoms.  Diagnosis: MDD (major depressive disorder), recurrent, severe, with psychosis (HCC) [F33.3]    1. MDD (major depressive disorder), recurrent, severe, with psychosis (HCC)       Donia GuilesJenny Somtochukwu Woollard, LCSW 03/27/2017

## 2017-03-27 NOTE — Therapy (Signed)
[facility] H[facility] PRO[facility] [address] [place], [place], [address] Phone: [phone]   Fax:  [phone]  Occupational Therapy Evaluation  Patient Details  Name: Virginia Bennett MRN: [id number] Date of Birth: 1995/03/05 Referring Provider: Dr. Lucianne MussKumar   Encounter Date: 03/26/2017  OT End of Session - 03/27/17 1003    Visit Number  1    Number of Visits  8    Date for OT Re-Evaluation  04/24/17    Authorization Type  cigna/cigna behavioral health    OT Start Time  1030    OT Stop Time  1140    OT Time Calculation (min)  70 min    Activity Tolerance  Patient tolerated treatment well    Behavior During Therapy  [facility] for tasks assessed/performed       Past Medical History:  Diagnosis Date  . ADHD (attention deficit hyperactivity disorder)   . GERD (gastroesophageal reflux disease)   . Learning disability    history of IEP beginning in 5th grade  . Vitamin D deficiency disease     Past Surgical History:  Procedure Laterality Date  . 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  Subjective Assessment - 03/27/17 1000    Currently in Pain?  No/denies    Pain Score  0-No pain      I dont like people that lie to me.  [facility] Assessment - 03/27/17 0001      Assessment   Medical Diagnosis  Difficulty Coping due to MDD    Referring Provider  Dr. Lucianne MussKumar        OT assessment Diagnosis  MDD  Past medical history ADHD Living situation unknown  ADLs indpendent, quit during 10 days prior to hospital admission Work was working at [employer], now unemployed Leisure Scientist, [employer]watch tv Social support mom and siblings Struggles taking time for self, hearing voices, relationship with bff/boyfriend OT goal learn to love self General Causality Orientation Scale   Subscore Percentile Score  Autonomy 59 68.98  Control 57 67.85  Impersonal 46 43.63    Motivation Type  Motivation type Explanation  o    o    o    o Mixed  The individual does not have a prominent motivational orientation.  Currently there is no clear theoretical explanation, and research shows this motivation type does not benefit from motivational interventions.      Skilled OT group focused on stress management this date.  Group opened with members identifying 6 stressful situations they have encountered.  Group weighed in on their personal level of stress with each situation.  Group discussed what causes varying levels of stress for the same situation.  Group discussed  recurrent physical and psychological stress can diminish self-esteem, decrease interpersonal and academic effectiveness and create a cycle of self-blame and self-doubt.  Group was educated on importance of identifying symptoms of stress, events are not stressful but rather our interpretations and reactions to event that are stressful.  Group member took a stress symptom checklist and discussed results.  Group discussed poor coping mechanisms including self medication, suppression, passivity, acting out, blaming/complaining), why it may work short term, as well as possible long term complications.  Group discussed and learned positive coping mechanisms to stress including: relaxation, positive mental attitude, support, self-care and lifestyle changes, interpersonal strategies, emotional strategies, cognitive strategies, and philosophical strategies.  Group member was given handouts with 150 suggestions to decrease and manage stress, and member was able to voice 1 new strategy they will implement to manage stress.    Assessment:  Patient demonstrates mixed motivation type.  Patient will benefit from occupational therapy intervention in order to improve time management, financial management, stress management, job readiness skills, social skills,sleep hygiene, exercise and healthy eating habits,  and health management  skills and other psychosocial skills needed for preparation to return to full time community living and to be a productive community member.  Patient minimally engaged in group this date.  Patient does commit to taking time for herself to decrease her overall stress level.   Plan:  Patient will participate in skilled occupational therapy sessions individually or in a group setting to improve coping skills, psychosocial skills, and emotional skills required to return to prior level of function as a productive community member. Treatment will be 1-2 times per week for 2-6 weeks.                     OT Education - 03/27/17 1002    Education provided  Yes    Education Details  Educated on Warehouse manager) Educated  Patient    Methods  Explanation;Handout    Comprehension  Verbalized understanding       OT Short Term Goals - 03/27/17 1005      OT SHORT TERM GOAL #1   Title  Patient will be educated on strategies to improve psychosocial skills needed to participate fully in all daily, work, and leisure activities.     Time  3    Period  Weeks    Status  New    Target Date  04/16/17      OT SHORT TERM GOAL #2   Title  Patient will be educated and independent with HEP.    Time  3    Period  Weeks    Status  New      OT SHORT TERM GOAL #3   Title   Patient will independently apply psychosocial skills and coping mechanisms to her daily activities in order to function independently    Time  3    Period  Weeks    Status  New               Plan - 03/27/17 1004    Occupational performance deficits (Please refer to evaluation for details):  ADL's;IADL's;Education;Work;Social Participation;Leisure    Rehab Potential  Good    OT Frequency  2x / week    OT Duration  4 weeks    OT Treatment/Interventions  Self-care/ADL training;Psychosocial skills training;Coping strategies training community reintegration training    Clinical Decision Making   Limited treatment options, no task modification necessary    Consulted and Agree with Plan of Care  Patient       Patient will benefit from skilled therapeutic intervention in order to improve the following deficits and impairments:  Decreased coping skills, Decreased psychosocial skills(decreased participation in ADLs)  Visit Diagnosis: Difficulty coping  MDD (major depressive disorder), single episode, severe with psychosis (HCC)    Problem List Patient Active Problem List   Diagnosis Date Noted  . MDD (major depressive disorder), recurrent, severe, with psychosis (HCC) 03/27/2017  . Adjustment disorder, unspecified 03/19/2017  . MDD (major depressive disorder), single episode, severe with psychosis (HCC) 03/18/2017  . Surveillance of implantable subdermal contraceptive 04/07/2014  . History of ADHD  04/07/2014  . BMI (body mass index), pediatric, greater than or equal to 95% for age 73/10/2014  . Behavior causing concern in biological child 03/01/2014  . Acne vulgaris 03/01/2014  . Slow transit constipation 03/01/2014    Shirlean Mylar, MHA, OTR/L [phone]  03/27/2017, 10:18 AM  [facility] HOSPITALIZATION PROGRAM [address] [place], [place], [address] Phone: [phone]   Fax:  [phone]  Name: Virginia Bennett MRN: [id number] Date of Birth: Oct 12, 1995

## 2017-03-27 NOTE — Psych (Cosign Needed Addendum)
Behavioral Health Partial Program Assessment Note  Date: 03/27/2017 Name: Virginia Bennett MRN: [id number]  Chief Complaint: Increased depression with suicidal ideations  Subjective: Virginia Bennett 22 year old female present after a recent inpatient hospitalization.  Reports he has been dealing with stress, depression and hearing voices  prior to her admission. Patient reports her mood has improved slight since discharging from inpatient. Patient was discharge on Lexapro, Trazodone and Gabapentin for depression, anxiety and insomnia.  Reported on 03/26/2017 Patient was seen tearful and distressed. Patient was evaluated  by Psychiatrist and  treatment team reports patient was experiencing command auditory hallucination. Medications was adjusted to Seroquel 100 mg QHS and Klonopin 0.5mg  for Anxiety.  Virginia Bennett reports " I am very guarded and I do not care to share all my issues today." Reports she did take the Seroquel as prescribed and is currently denying depression or auditory hallucination. Reports " I did take the medication my mother gave it to me and I did even smoke on last night"  Reported daily mariajuana use.  Reports she feels the seroquel is helping her appetite.Support, encouragement and reassurance was provided.    HPI: Patient is a 22 y.o. African American female presents with depression and auditory halluincations.  Patient was enrolled in partial psychiatric program on 03/26/17.  Primary complaints include: anxiety, depression worse, feeling depressed and poor concentration.  Onset of symptoms was gradual with gradually worsening course since that time. Psychosocial Stressors include the following: family and drug and alcohol.   I have reviewed the following documentation dated 03/21/2017: past psychiatric history, past medical history, past social and family history and past Review of systems  Complaints of Pain: nonear Past Psychiatric History:  First psychiatric contact was  with this recent inpatient admission , Past psychiatric hospitalizations was once 02/2017. 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 FOOT SURGERY Bilateral 2017   Reports had a bond taken out of both feet to correct fallen arches    Past Medical History:  Diagnosis Date  . ADHD (attention deficit hyperactivity disorder)   . GERD (gastroesophageal reflux disease)   . Learning disability    history of IEP beginning in 5th grade  . Vitamin D deficiency disease    Outpatient Encounter Medications as of 03/27/2017  Medication Sig  . clonazePAM (KLONOPIN) 0.5 MG tablet Take 1 tablet (0.5 mg total) by mouth 2 (two) times daily.  Marland Kitchen. gabapentin (NEURONTIN) 100 MG capsule Take 1 capsule (100 mg total) by mouth 2 (two) times daily. Withdrawal symptoms  . hydrOXYzine (ATARAX/VISTARIL) 25 MG tablet Take 1 tablet (25 mg total) by mouth 3 (three) times daily as needed for anxiety.  . pantoprazole (PROTONIX) 40 MG tablet Take 1 tablet (40 mg total) by mouth every evening. For acid reflux  . QUEtiapine (SEROQUEL) 100 MG tablet Take 1 tablet (100 mg total) by mouth at bedtime.   No facility-administered encounter medications on file as of 03/27/2017.    Allergies  Allergen Reactions  . Aspirin     Father & Siblings are allergic. So Mom doesn't even give to patient.    Social History   Tobacco Use  . Smoking  status: Never Smoker  . Smokeless tobacco: Never Used  Substance Use Topics  . Alcohol use: No    Alcohol/week: 0.0 oz   Functioning Relationships: good support system, good relationship with parents and fearful & suspicious of most people Education:  Other Pertinent History:   Family History  Problem Relation Age of Onset  . Diabetes Father   . Asthma Sister   . Asthma Sister   . Anxiety disorder Cousin   . Depression Cousin   . Schizophrenia Cousin      Review of Systems Positive  for Depression, Hallucination  Objective:  There were no vitals filed for this visit.  Physical Exam:   Mental Status Exam: Appearance:  Casually dressed Psychomotor::  Within Normal Limits Attention span and concentration: Normal Behavior: calm and cooperative Speech:  normal pitch, normal volume and soft Mood:  depressed and anxious Affect:  normal Thought Process:  Coherent Thought Content:  Hallucinations: Auditory command hallucination was reported on admission on  Orientation:  person, place, time/date and situation Cognition:  grossly intact Insight:  Fair Judgment:  Fair Estimate of Intelligence: Average Fund of knowledge: Aware of current events Memory: Recent and remote intact Abnormal movements: None Gait and station: Normal  Assessment:  Diagnosis: MDD (major depressive disorder), recurrent, severe, with psychosis (HCC) [F33.3] 1. MDD (major depressive disorder), recurrent, severe, with psychosis (HCC)     Indications for admission: inpatient care required if not in partial hospital program  Plan: patient enrolled in Partial Hospitalization Program  MDD:   Continue Seroquel 100 mg PO QHS  Continue Klonopin  0.5mg  PRN   Lab:   UDS 12 panel to be collected on 03/31/2017    Treatment plan reviewed  and agreed upon by NP. Chilton Greathouse and patient [employer]  need for group services    Oneta Rack, NP

## 2017-03-27 NOTE — Psych (Signed)
   [facility]  Virginia Bennett [id number]  Session Time: 9:00 - 10:30  Participation Level: Minimal  Behavioral Response: CasualAlertDepressed  Type of Therapy: Group Therapy  Treatment Goals addressed: Coping  Interventions: CBT, DBT, Solution Focused, Supportive and Reframing  Summary: Clinician led check-in regarding current stressors and situation, and review of patient completed daily inventory. Clinician utilized active listening and empathetic response and validated patient emotions. Clinician facilitated processing group on pertinent issues.    Therapist Response: Virginia GinsMakenzie Tool is a 22 y.o. female who presents with depression symptoms.  Patient arrived within time allowed and reports that she is feeling "I don't know." Patient rates her mood at a 4 on a scale of 1-10 with 10 being great. Pt reports that she "came here. Went home." Pt is shut down throughout most of group and volunteers little information.      Session Time: 10:30 -11:30   Participation Level: minimal   Behavioral Response: CasualAlertDepressed   Type of Therapy: Group Therapy, OT   Treatment Goals addressed: Coping   Interventions: Psychosocial skills training, Supportive,    Summary:  Occupational Therapy group   Therapist Response: Patient minimally engaged in group. See OT note.           Session Time: 11:30 - 12:15   Participation Level: Minimal   Behavioral Response: CasualAlertDepressed   Type of Therapy: Group Therapy, Psychotherapy; Psychoeducation   Treatment Goals addressed: Coping   Interventions: CBT, Solution focused, Supportive, Reframing   Summary: Cln introduced topic of communication. Cln discussed 3 components of communication: nonverbals, words, and listening. Group discussed nonverbals: what they are and how they affect communication.    Therapist Response: Patient minimally engaged in group. Pt reports understanding of topic.         Session Time: 12:15 - 1:00  Participation Level: Minimal  Behavioral Response: CasualAlertDepressed  Type of Therapy: Group Therapy, Activity Therapy  Treatment Goals addressed: Coping  Interventions: Psychologist, occupationalocial Skills Training, Supportive  Summary:  Reflection Group: Patients encouraged to practice skills and interpersonal techniques or work on mindfulness and relaxation techniques. The importance of self-care and making skills part of a routine to increase usage were stressed   Therapist Response: Patient minimally engaged.     *Pt left at 1:00 after meeting with psychiatrist and RN. See RN note. Pt denies safety risks before leaving.      Suicidal/Homicidal: Nowithout intent/plan  Plan: Pt will continue in PHP with medication management while continuing to address managing psychosis, decreasing depression symptoms, and increasing ability to manage symptoms.  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 MDD (major depressive disorder), single episode, severe with psychosis (HCC)       Donia GuilesJenny Julie Paolini, LCSW 03/27/2017

## 2017-03-30 ENCOUNTER — Other Ambulatory Visit (HOSPITAL_COMMUNITY): Payer: 59 | Admitting: Licensed Clinical Social Worker

## 2017-03-30 ENCOUNTER — Other Ambulatory Visit (HOSPITAL_COMMUNITY): Payer: Self-pay

## 2017-03-30 DIAGNOSIS — F333 Major depressive disorder, recurrent, severe with psychotic symptoms: Secondary | ICD-10-CM

## 2017-03-30 DIAGNOSIS — F323 Major depressive disorder, single episode, severe with psychotic features: Secondary | ICD-10-CM

## 2017-03-30 NOTE — Progress Notes (Signed)
Labs drawn for thc levels on Monday morning. Patient presented with appropriate affect, labs were drawn from the right arm, patient tolerated well and without complaint.

## 2017-03-31 ENCOUNTER — Other Ambulatory Visit (HOSPITAL_COMMUNITY): Payer: 59 | Admitting: Occupational Therapy

## 2017-03-31 ENCOUNTER — Other Ambulatory Visit (HOSPITAL_COMMUNITY): Payer: 59 | Admitting: Licensed Clinical Social Worker

## 2017-03-31 ENCOUNTER — Encounter (HOSPITAL_COMMUNITY): Payer: Self-pay | Admitting: Occupational Therapy

## 2017-03-31 DIAGNOSIS — F333 Major depressive disorder, recurrent, severe with psychotic symptoms: Secondary | ICD-10-CM

## 2017-03-31 DIAGNOSIS — R4589 Other symptoms and signs involving emotional state: Secondary | ICD-10-CM

## 2017-03-31 NOTE — Progress Notes (Signed)
[facility] MD/PA/NP Partial Hospitalization Progress Note  03/31/2017 10:24 AM Virginia Bennett  MRN:  [id number]  Evaluation: Virginia Bennett is awake, alert and oriented. Seen attending group session. Reports she is feeling slightly better than when she started. Patient was started on Seroquel 50 mg a feels as if this is not helping with her depression. Patient continues to be uncertain when asked about auditory hallucination " I don't pay it any attention, its not as loud as it was when I was crying that day."  Reports " the sounds was stronger and louder but not really nothing today."  Virginia Bennett states " I need a antidepressants to be restarted, I don't think the Klonopin is strong enough"      Reports she is taken Klonopin 0.5mg  BID and Seroquel 50 mg QHS. Patient is requesting to take her prdx Ativan and Vistaril at the same time. Discussed increasing Seroquel to 100 mg QHS and 25 mg PO BID and take Klonopin PRN and Start Vistaril PRN.    Denies suicidal or homicidal ideation during this assessment. Denies auditory or visual hallucination and does not appear to be responding to internal stimuli. Reports a good appetite. Support, encouragement and reassurance was provided.     Visit Diagnosis: No diagnosis found.  Past Psychiatric History:  Past Medical History:  Past Medical History:  Diagnosis Date  . ADHD (attention deficit hyperactivity disorder)   . GERD (gastroesophageal reflux disease)   . Learning disability    history of IEP beginning in 5th grade  . Vitamin D deficiency disease     Past Surgical History:  Procedure Laterality Date  . FOOT SURGERY Bilateral 2017   Reports had a bond taken out of both feet to correct fallen arches    Family Psychiatric History:  Family History:  Family History  Problem Relation Age of Onset  . Diabetes Father   . Asthma Sister   . Asthma Sister   . Anxiety disorder Cousin   . Depression Cousin   . Schizophrenia Cousin     Social  History:  Social History   Socioeconomic History  . Marital status: Single    Spouse name: Not on file  . Number of children: Not on file  . Years of education: Not on file  . Highest education level: Not on file  Social Needs  . Financial resource strain: Not hard at all  . Food insecurity - worry: Never true  . Food insecurity - inability: Never true  . Transportation needs - medical: No  . Transportation needs - non-medical: No  Occupational History  . Not on file  Tobacco Use  . Smoking status: Never Smoker  . Smokeless tobacco: Never Used  Substance and Sexual Activity  . Alcohol use: No    Alcohol/week: 0.0 oz  . Drug use: Yes    Frequency: 21.0 times per week    Types: Marijuana  . Sexual activity: Yes    Partners: Male    Birth control/protection: None  Other Topics Concern  . Not on file  Social History Narrative   Lives at home mom, mom's spouse 3 siblings. No pets. No smokers. Mom works days as Public house manager. Stepfather is at home. May visit father in [place] in the summer.    Allergies:  Allergies  Allergen Reactions  . Aspirin     Father & Siblings are allergic. So Mom doesn't even give to patient.    Metabolic Disorder Labs: Lab Results  Component Value Date   HGBA1C  4.9 06/20/2016   MPG 94 06/20/2016   MPG 105 03/01/2014   No results found for: PROLACTIN Lab Results  Component Value Date   CHOL 122 03/01/2014   TRIG 51 03/01/2014   HDL 37 03/01/2014   CHOLHDL 3.3 03/01/2014   VLDL 10 03/01/2014   LDLCALC 75 03/01/2014   Lab Results  Component Value Date   TSH 0.86 06/20/2016    Therapeutic Level Labs: No results found for: LITHIUM No results found for: VALPROATE No components found for:  CBMZ  Current Medications: Current Outpatient Medications  Medication Sig Dispense Refill  . clonazePAM (KLONOPIN) 0.5 MG tablet Take 1 tablet (0.5 mg total) by mouth 2 (two) times daily. 60 tablet 0  . gabapentin (NEURONTIN) 100 MG capsule Take 1  capsule (100 mg total) by mouth 2 (two) times daily. Withdrawal symptoms 60 capsule 0  . hydrOXYzine (ATARAX/VISTARIL) 25 MG tablet Take 1 tablet (25 mg total) by mouth 3 (three) times daily as needed for anxiety. 30 tablet 0  . pantoprazole (PROTONIX) 40 MG tablet Take 1 tablet (40 mg total) by mouth every evening. For acid reflux 30 tablet 0  . QUEtiapine (SEROQUEL) 100 MG tablet Take 1 tablet (100 mg total) by mouth at bedtime. 90 tablet 0   No current facility-administered medications for this visit.      Musculoskeletal: Strength & Muscle Tone: within normal limits Gait & Station: normal Patient leans: N/A  Psychiatric Specialty Exam: ROS  There were no vitals taken for this visit.There is no height or weight on file to calculate BMI.  General Appearance: Casual  Eye Contact:  Good  Speech:  Clear and Coherent  Volume:  Normal  Mood:  Anxious and Depressed  Affect:  Congruent  Thought Process:  Coherent  Orientation:  Full (Time, Place, and Person)  Thought Content: Hallucinations: passive auditory "sounds'   Suicidal Thoughts:  No  Homicidal Thoughts:  No  Memory:  Immediate;   Fair Recent;   Fair Remote;   Fair  Judgement:  Fair  Insight:  Present  Psychomotor Activity:  Normal  Concentration:  Concentration: Fair  Recall:  [employer] of Knowledge: Fair  Language: Good  Akathisia:  No  Handed:  Right  AIMS (if indicated):   Assets:  Communication Skills Desire for Improvement Resilience Social Support  ADL's:  Intact  Cognition: WNL  Sleep:  Fair   Screenings: AIMS     [facility]  AIMS Total Score  0    AUDIT     [facility]  Alcohol Use Disorder Identification Test Final Score (AUDIT)  0    GAD-7     Counselor from [facility]  Total GAD-7 Score  21     PHQ2-9     Counselor from 03/26/2017 in [facility] Office Visit from 03/01/2014 in [facility]  PHQ-2 Total Score  6  3  PHQ-9 Total Score  23  9       Assessment and Plan:  -Continue PHP  MDD with psychotic features    Increased Seroquel 50 mg to 100 mg QHS   Start Seroquel 50 mg PO BID  Anxiety:   Discontinue Klonopin 0.5 mg BID and take PRN  Treatment plan agreed upon and discuses by NP and patient continued need for group  services.     Oneta Rackanika N Naidelin Gugliotta, NP 03/31/2017, 10:24 AM

## 2017-03-31 NOTE — Psych (Signed)
[facility] PHP THERAPIST PROGRESS NOTE  Virginia Bennett [id number]  Session Time: 9:00 - 10:30  Participation Level: Minimal  Behavioral Response: CasualAlertDepressed  Type of Therapy: Group Therapy  Treatment Goals addressed: Coping  Interventions: CBT, DBT, Solution Focused, Supportive and Reframing  Summary: Clinician led check-in regarding current stressors and situation, and review of patient completed daily inventory. Clinician utilized active listening and empathetic response and validated patient emotions. Clinician facilitated processing group on pertinent issues.    Therapist Response: Virginia Bennett is a 22 y.o. female who presents with depression symptoms.  Patient arrived within time allowed and reports that she is feeling "okay." Patient rates her mood at a 4 on a scale of 1-10 with 10 being great. Pt reports the weekend was "alright" and she "stayed around the house." Pt reports need to increase ability to not shut down.       Session Time: 10:30 -11:30 Participation Level: Active  Behavioral Response: CasualAlertDepressed  Type of Therapy: Group Therapy, psychoeducation, psychotherapy  Treatment Goals addressed: Coping  Interventions: CBT, DBT, Solution Focused, Supportive and Reframing  Summary:  Clinician introduced topic of feelings and emotions. Group viewed feeling faces handout and identified which three emotions they feel right now and which they want to feel. Clinician discussed the way to contextualize feelings as things that come and go and the ability to choose which feelings we attach to. Cln used metaphor of leaves in the wind.    Therapist Response: Patient participated and shared the three feelings she wants to feel more of are: "determined, excited, peaceful." Pt reports understanding of feelings and their purpose.     Session Time: 11:30 - 12:15   Participation Level: Active   Behavioral Response: CasualAlertDepressed   Type of  Therapy: Group Therapy, Psychotherapy   Treatment Goals addressed: Coping   Interventions: CBT, Solution focused, Supportive, Reframing   Summary: Cln continued topic of feelings. Cln provided education on the difference between feelings and reactions to feelings, highlighting that our reactions we can alter.    Therapist Response: Patient participated and reports understanding of the role of feelings and the differences between feelings and our reactions to feelings.        Session Time: 12:15 - 1:00  Participation Level: Active  Behavioral Response: CasualAlertDepressed  Type of Therapy: Group Therapy, Activity Therapy  Treatment Goals addressed: Coping  Interventions: Psychologist, occupational, Supportive  Summary:  Reflection Group: Patients encouraged to practice skills and interpersonal techniques or work on mindfulness and relaxation techniques. The importance of self-care and making skills part of a routine to increase usage were stressed   Therapist Response: Patient engaged and participated appropriately.       Session Time: 12:45- 2:00  Participation Level: Active  Behavioral Response: CasualAlertDepressed  Type of Therapy: Group Therapy, Psychoeducation; Psychotherapy  Treatment Goals addressed: Coping  Interventions: CBT; Solution focused; Supportive; Reframing  Summary: 12:45 - 1:50: Clinician continued topic of feelings. Cln discussed 3 states of mind: emotion mind, reason mind, and wise mind. Pt's shared examples of when they were in each state of mind. Clinician utilized hand out "ways to actively challenge your own beliefs" and pt's discussed ways to access reason mind through facts/logic.   1:50 -2:00 Clinician led check-out. Clinician assessed for immediate needs, medication compliance and efficacy, and safety concerns   Therapist Response: Patient engaged in activity and discussion. Pt was able to walk through examples of how to challenge beliefs  and access reason mind.  At check-out, patient  rates her mood at a 6 on a scale of 1-10 with 10 being great. Patient reports plans of spending time with her mom and going grocery shopping.  Patient demonstrates some progress as evidenced by increased animation as group progressed. Patient denies SI/HI/self-harm at the end of group.     Suicidal/Homicidal: Nowithout intent/plan  Plan: Pt will continue in PHP with medication management while continuing to address managing psychosis, decreasing depression symptoms, and increasing ability to manage symptoms.  Diagnosis: MDD (major depressive disorder), recurrent, severe, with psychosis (HCC) [F33.3]    1. MDD (major depressive disorder), recurrent, severe, with psychosis (HCC)       Virginia GuilesJenny Floyce Bujak, LCSW 03/31/2017

## 2017-04-01 ENCOUNTER — Encounter (HOSPITAL_COMMUNITY): Payer: Self-pay

## 2017-04-01 ENCOUNTER — Other Ambulatory Visit (HOSPITAL_COMMUNITY): Payer: 59 | Admitting: Licensed Clinical Social Worker

## 2017-04-01 VITALS — BP 108/78 | HR 79 | Ht 66.25 in | Wt 188.0 lb

## 2017-04-01 DIAGNOSIS — F333 Major depressive disorder, recurrent, severe with psychotic symptoms: Secondary | ICD-10-CM

## 2017-04-01 NOTE — Psych (Signed)
[facility] BH PHP THERAPIST PROGRESS NOTE  Virginia Bennett [id number]  Session Time: 9:00 - 10:30  Participation Level: Minimal  Behavioral Response: CasualAlertEuphoric  Type of Therapy: Group Therapy  Treatment Goals addressed: Coping  Interventions: CBT, DBT, Solution Focused, Supportive and Reframing  Summary: Clinician led check-in regarding current stressors and situation, and review of patient completed daily inventory. Clinician utilized active listening and empathetic response and validated patient emotions. Clinician facilitated processing group on pertinent issues.    Therapist Response: Virginia Bennett is a 22 y.o. female who presents with depression symptoms.  Patient arrived within time allowed and reports that she is feeling "great." Patient rates her mood at a 7.5 on a scale of 1-10 with 10 being great. Pt reports she only slept two hours and wrote in her journal all night long. Pt reports shje is in a "great" mood and presents in a consistent manner with a big smile, animated body language, and excited tone. Cln noted possibility of early stage mania and substance use. Clt reports using marijuana last night, half a blunt, and denies any further substance use. Pt reports need to work on achieving balance.       Session Time: 10:30 -11:30   Participation Level: Active   Behavioral Response: CasualAlertDepressed   Type of Therapy: Group Therapy, OT   Treatment Goals addressed: Coping   Interventions: Psychosocial skills training, Supportive,    Summary:  Occupational Therapy group   Therapist Response: Patient engaged in group. See OT note.           Session Time: 11:30 - 12:15   Participation Level: Active   Behavioral Response: CasualAlertDepressed   Type of Therapy: Group Therapy, Psychotherapy; Psychoeducation   Treatment Goals addressed: Coping   Interventions: CBT, Solution focused, Supportive, Reframing   Summary: Cln led psychotherapy  group on grief and loss. Pt's shared different experiences of grief and how they manage loss in their lives.     Therapist Response: Patient engaged in group. Pt shares regarding the loss of her grandfather and how it effected her. Pt is able to share her grief journey and provide feedback to others.        Session Time: 12:15 - 1:00  Participation Level: Active  Behavioral Response: CasualAlertDepressed  Type of Therapy: Group Therapy, Activity Therapy  Treatment Goals addressed: Coping  Interventions: Psychologist, occupational, Supportive  Summary:  Reflection Group: Patients encouraged to practice skills and interpersonal techniques or work on mindfulness and relaxation techniques. The importance of self-care and making skills part of a routine to increase usage were stressed   Therapist Response: Patient engaged and participated appropriately.       Session Time: 12:45- 2:00  Participation Level: Active  Behavioral Response: CasualAlertDepressed  Type of Therapy: Group Therapy, Psychoeducation; Psychotherapy  Treatment Goals addressed: Coping  Interventions: CBT; Solution focused; Supportive; Reframing  Summary: 12:45 - 1:50: Clinician introduced topic of "Positive Psychology." Group watched "The Happiness Advantage" TED talk and discussed how the "lens" through which they view life affects the way they feel. Pts identified a strategy they would be willing to try to change their "lens."  1:50 -2:00 Clinician led check-out. Clinician assessed for immediate needs, medication compliance and efficacy, and safety concerns   Therapist Response: Patient engaged in activity and discussion. Pt reports she is going to try writing 3 gratitudes daily to help change her lens. Pt reports she will write the gratitudes at night before bed. At check-out, patient rates her mood at an  8 on a scale of 1-10 with 10 being great. Patient reports plans of spending the evening with her sister.  Patient demonstrates some progress as evidenced by being able to identify things and people she is grateful for in her life. Patient denies SI/HI/self-harm at the end of group.      Suicidal/Homicidal: Nowithout intent/plan  Plan: Pt will continue in PHP with medication management while continuing to address managing psychosis, decreasing depression symptoms, and increasing ability to manage symptoms.  Diagnosis: MDD (major depressive disorder), recurrent, severe, with psychosis (HCC) [F33.3]    1. MDD (major depressive disorder), recurrent, severe, with psychosis (HCC)       Donia GuilesJenny Ricarda Atayde, LCSW 04/01/2017

## 2017-04-01 NOTE — Therapy (Signed)
[facility] PARTIAL HOSPITALIZATION PROGRAM [address] [place], [place], [address] Phone: [phone]   Fax:  [phone]  Occupational Therapy Treatment  Patient Details  Name: Virginia Bennett MRN: [id number] Date of Birth: 01/15/1996 Referring Provider: Dr. Lucianne Muss   Encounter Date: 03/31/2017  OT End of Session - 04/01/17 1737    Visit Number  2    Number of Visits  8    Date for OT Re-Evaluation  04/24/17    Authorization Type  cigna/cigna behavioral health    OT Start Time  1030    OT Stop Time  1130    OT Time Calculation (min)  60 min    Activity Tolerance  Patient tolerated treatment well    Behavior During Therapy  [facility] for tasks assessed/performed       Past Medical History:  Diagnosis Date  . ADHD (attention deficit hyperactivity disorder)   . GERD (gastroesophageal reflux disease)   . Learning disability    history of IEP beginning in 5th grade  . Vitamin D deficiency disease     Past Surgical History:  Procedure Laterality Date  . 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  Subjective Assessment - 03/31/17 2148    Currently in Pain?  No/denies         [facility] OT Assessment - 04/01/17 1736      Assessment   Medical Diagnosis  MDD    Referring Provider  --    Onset Date/Surgical Date  --      Precautions   Precautions  --      Balance Screen   Has the patient fallen in the past 6 months  --    Has the patient had a decrease in activity level because of a fear of falling?   --    Is the patient reluctant to leave their home because of a fear of falling?   --         OT Treatment Session: Social and Communication Skills III  S:  "I thought I would just put accept for that one but I guess I can influence it."   O:  Patient actively participated in the following skilled occupational therapy treatment session this  date:             Pt participated in discussion of social and communication skills including the importance of social skills in various settings. First group activity focusing on active listening and open communication via dialogue was completed next. Pt participated in small group/pair activity-participants given a subject to discuss. One person talks about the subject for 3 minutes while the other person practices active listening; then the listener recapped what the speaker said but did not include opinions or personal thoughts. Pairs/groups then switch roles for the next subject. Pt then participated in activity-"Elephant in the Room" focusing on sharing a difficult or taboo subject or situation and working through a problem-solving process with a small group. Pt was asked to identify if the situation was one he/she could C, I, or A-control, influence, or accept. Pt then actively participated in discussion with group to determine if any other classification would better fit the situation.    A:  Patient participated in skilled occupational therapy group for social and communication skills this date.  Patient was engaged and open to ideas and strategies introduced.  P:  Continue participation in skilled occupational therapy groups  1-2 times per week for 2 weeks in order to gain the necessary skills needed to return to full time community living and learn effective coping strategies to be a productive community resident. Next session: Time management                 OT Short Term Goals - 04/01/17 1737      OT SHORT TERM GOAL #1   Title  Patient will be educated on strategies to improve psychosocial skills needed to participate fully in all daily, work, and leisure activities.     Time  3    Period  Weeks    Status  On-going      OT SHORT TERM GOAL #2   Title  Patient will be educated and independent with HEP.    Time  3    Period  Weeks    Status  On-going      OT SHORT TERM  GOAL #3   Title   Patient will independently apply psychosocial skills and coping mechanisms to her daily activities in order to function independently    Time  3    Period  Weeks    Status  On-going                 Patient will benefit from skilled therapeutic intervention in order to improve the following deficits and impairments:  Decreased coping skills, Decreased psychosocial skills(decreased participation in ADLs)  Visit Diagnosis: MDD (major depressive disorder), recurrent, severe, with psychosis (HCC)  Difficulty coping    Problem List Patient Active Problem List   Diagnosis Date Noted  . MDD (major depressive disorder), recurrent episode (HCC) 03/27/2017  . Adjustment disorder, unspecified 03/19/2017  . MDD (major depressive disorder), single episode, severe with psychosis (HCC) 03/18/2017  . Surveillance of implantable subdermal contraceptive 04/07/2014  . History of ADHD 04/07/2014  . BMI (body mass index), pediatric, greater than or equal to 95% for age 12/30/2014  . Behavior causing concern in biological child 03/01/2014  . Acne vulgaris 03/01/2014  . Slow transit constipation 03/01/2014   Ezra SitesLeslie Clessie Karras, OTR/L  [phone] 04/01/2017, 5:38 PM  [facility] PRO[facility] [address] [place], [place], [address] Phone: [phone]   Fax:  [phone]  Name: Virginia Bennett MRN: [id number] Date of Birth: 1995-03-13

## 2017-04-01 NOTE — Progress Notes (Signed)
Patient presents with brighter affect, still some depressed mood as rated her depression a 10, anxiety a 9, and hopelessness a 5 on a scale of 0-10 with 0 being none and 10 the worst she could manage.  Patient stated her depression was better overall but was upset some today after finding our about one of her brother's 20 year old friend's death unexpectedly on yesterday.  Patient denied any current auditory or visual hallucinations, no suicidal or homicidal ideations and reported although she is still hearing some auditory "voices" at times, that these are improved with increase in Seroquel to 100 mg, 2 at bedtime now has helped.  Patient denied any current side effects to all medications, reports sleep and appetite have also improved and will continue to keep PHP staff informed of her progress and management of symptoms.  Patient reported no other problems at this time and will inform staff with any worsening of symptoms or other concerns.

## 2017-04-02 ENCOUNTER — Telehealth: Payer: Self-pay | Admitting: Family

## 2017-04-02 ENCOUNTER — Other Ambulatory Visit (HOSPITAL_COMMUNITY): Payer: 59 | Admitting: Specialist

## 2017-04-02 ENCOUNTER — Other Ambulatory Visit (HOSPITAL_COMMUNITY): Payer: 59 | Admitting: Licensed Clinical Social Worker

## 2017-04-02 ENCOUNTER — Encounter (HOSPITAL_COMMUNITY): Payer: Self-pay

## 2017-04-02 DIAGNOSIS — F333 Major depressive disorder, recurrent, severe with psychotic symptoms: Secondary | ICD-10-CM

## 2017-04-02 NOTE — Psych (Signed)
[facility] PHP THERAPIST PROGRESS NOTE  Virginia Bennett [id number]  Session Time: 9:00 - 10:30  Participation Level: Minimal  Behavioral Response: CasualAlertDepressed  Type of Therapy: Group Therapy  Treatment Goals addressed: Coping  Interventions: CBT, DBT, Solution Focused, Supportive and Reframing  Summary: Clinician led check-in regarding current stressors and situation, and review of patient completed daily inventory. Clinician utilized active listening and empathetic response and validated patient emotions. Clinician facilitated processing group on pertinent issues.    Therapist Response: Virginia Bennett is a 22 y.o. female who presents with depression symptoms.  Patient arrived within time allowed and reports that she does not know how she is feeling." Pt rates her mood at a 1 on scale of 1-10 with 10 being great. Pt reported that she had an "alright" evening but did not go into detail about what she did. Pt reports that she is still working on putting the skills she is learning in group to use. Pt engaged in discussion.  Cln stepped out with [name], halfway through group when pt became agitated. Pt shares that her friend died the previous day and she is very upset. Pt was tearful and slightly hysterical. Pt reports "voices" were in her head telling her she did not matter and would be better off dead. Cln coached pt to talk back to the voices and tell them why she matters and who would miss her. Pt engaged with cln and de-escalated her emotion. Cln retrieved PHP provider who met with pt.  Pt returned to group after.       Session Time: 10:30 -12:00 ? Participation Level:Active ? Behavioral Response:CasualAlertDepressed ? Type of Therapy: Group Therapy, pscyhoeducation, psychotherapy ? Treatment Goals addressed: Coping ? Interventions: DBT, supportive, reframing ? Summary: Cln introduced topic of mindfulness. Cln discussedover the "what" and "how" of mindfulness  skills handouts. Cln also showed the [employer], "Do Not Try To Be Mindful." ? Therapist Response: Patient engaged in activity and discussion. Patient reported that she has never heard of mindfulness before or what it is. Patient shared that she was interested in this topic and was taking notes during the discussion.   ? ? ? ? Session Time: 12:00 - 12:45 ? Participation Level: Active ? Behavioral Response: CasualAlertDepressed ? Type of Therapy: Group Therapy, Activity Therapy ? Treatment Goals addressed: Coping ? Interventions: Systems analyst, Supportive ? Summary: Reflection Group: Patients encouraged to practice skills and interpersonal techniques or work on mindfulness and relaxation techniques. The importance of self-care and making skills part of a routine to increase usage were stressed  ? Therapist Response: Patient engaged and participated appropriately.  ? ? ? ? ? Session Time: 12:45- 2:00 ? Participation Level: Active ? Behavioral Response: CasualAlertDepressed ? Type of Therapy: Group Therapy, Activity group, Psychotherapy ? Treatment Goals addressed: Coping ? Interventions: CBT; Solution focused; Supportive; Reframing ? Summary: 12:45 - 1:50: Cln continued mindfulness discussion and led a mindfulness workshop, teaching the patients different ways to be mindful.  1:50 -2:00 Clinician led check-out. Clinician assessed for immediate needs, medication compliance and efficacy, and safety concerns ? ? Therapist Response: Patient engaged in activity and discussion. Pt stated that she is going to practice the five senses technique next time she is anxious and needs to be distracted. Pt shared that this skill would be the best for to use as she can do it anywhere.  At [place], patient rates her mood at a 6 on a scale of 1-10 with 10 being great. Pt reports she has is  going to go to her sister's house after group and take a nap. Patient demonstrates some progress  as evidenced by taking notes during group so that she can have them later to review. Patient denies SI/HI/self-harm at the end of group.      Suicidal/Homicidal: Nowithout intent/plan  Plan: Pt will continue in PHP with medication management while continuing to address managing psychosis, decreasing depression symptoms, and increasing ability to manage symptoms.  Diagnosis: MDD (major depressive disorder), recurrent, severe, with psychosis ([place]) [F33.3]    1. MDD (major depressive disorder), recurrent, severe, with psychosis ([place])       Lorin Glass, LCSW 04/02/2017

## 2017-04-02 NOTE — Therapy (Signed)
[facility] PARTIAL HOSPITALIZATION PROGRAM [address] [place], [place], [address] Phone: [phone]   Fax:  [phone]  Occupational Therapy Treatment  Patient Details  Name: Virginia Bennett MRN: [id number] Date of Birth: [birth year] Referring Provider: Dr. Lucianne Muss   Encounter Date: 04/02/2017  OT End of Session - 04/02/17 2116    Visit Number  3    Number of Visits  8    Date for OT Re-Evaluation  04/24/17    Authorization Type  cigna/cigna behavioral health    OT Start Time  1030    OT Stop Time  1130    OT Time Calculation (min)  60 min    Activity Tolerance  Patient tolerated treatment well    Behavior During Therapy  [facility] for tasks assessed/performed       Past Medical History:  Diagnosis Date  . ADHD (attention deficit hyperactivity disorder)   . GERD (gastroesophageal reflux disease)   . Learning disability    history of IEP beginning in 5th grade  . Vitamin D deficiency disease     Past Surgical History:  Procedure Laterality Date  . 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  Subjective Assessment - 04/02/17 2116    Currently in Pain?  No/denies         [facility] OT Assessment - 04/01/17 1736      Assessment   Medical Diagnosis  MDD    Referring Provider  --    Onset Date/Surgical Date  --      Precautions   Precautions  --      Balance Screen   Has the patient fallen in the past 6 months  --    Has the patient had a decrease in activity level because of a fear of falling?   --    Is the patient reluctant to leave their home because of a fear of falling?   --            S:  When I dont want to deal I just say I dont cre, even though I really do.  .   O  :Group opened with members defining accountability.  Leader educated group on definition of personal accountability, each individual being responsible for their  own actions and holding themselves to this expected standard.  Personal accountability is doing what you know you should do, when you should it.  Group discussed if accountability was positive on negative theme for themselves.  Group was educated on three categories of accountability:  actions and choices, responsibilities, and goals.  Group provided examples of each accountability and identified the type that they most struggle with.  Leader educated group on use of prioritized to do list each day, labeling items with a if they absolutely must be done, b if they better be done, c if they could be done, and d if the task could be delegated.  Group then played the blame game, picking blame statements to read aloud and explain how they resononate with them.  Member was then instructed to discuss how the statement could be changed to be more accountable frame of thinking vs blaming.  Group closed with discussion on why accountability can be difficult and what strategies they are willing to use to stay accountable to themselves.  A:  Patient was engaged in group.  Patient does  not currently use a to do list and agrees that she probably has missed meetings, opportunities, etc due to not keeping up with schedule.   P:  Continue skilled OT group intervention for improved ability to reintegrated into community living.  Group focus self esteem.  Shirlean MylarBethany H. Davon Abdelaziz, MHA, OTR/L [phone]               OT Education - 04/02/17 2116    Education provided  Yes    Education Details  educated on personal accountability and prioritized to do list    Person(s) Educated  Patient    Methods  Explanation    Comprehension  Verbalized understanding       OT Short Term Goals - 04/01/17 1737      OT SHORT TERM GOAL #1   Title  Patient will be educated on strategies to improve psychosocial skills needed to participate fully in all daily, work, and leisure activities.     Time  3    Period  Weeks    Status   On-going      OT SHORT TERM GOAL #2   Title  Patient will be educated and independent with HEP.    Time  3    Period  Weeks    Status  On-going      OT SHORT TERM GOAL #3   Title   Patient will independently apply psychosocial skills and coping mechanisms to her daily activities in order to function independently    Time  3    Period  Weeks    Status  On-going               Plan - 04/02/17 2117    OT Treatment/Interventions  Self-care/ADL training;Psychosocial skills training;Coping strategies training       Patient will benefit from skilled therapeutic intervention in order to improve the following deficits and impairments:  Decreased coping skills, Decreased psychosocial skills  Visit Diagnosis: MDD (major depressive disorder), recurrent, severe, with psychosis ([facility])    Problem List Patient Active Problem List   Diagnosis Date Noted  . MDD (major depressive disorder), recurrent episode ([facility]) 03/27/2017  . Adjustment disorder, unspecified 03/19/2017  . MDD (major depressive disorder), single episode, severe with psychosis ([facility]) 03/18/2017  . Surveillance of implantable subdermal contraceptive 04/07/2014  . History of ADHD 04/07/2014  . BMI (body mass index), pediatric, greater than or equal to 95% for age 03/30/2014  . Behavior causing concern in biological child 03/01/2014  . Acne vulgaris 03/01/2014  . Slow transit constipation 03/01/2014    Sondra BargesMurray, Nasario Czerniak [place] 04/02/2017, 9:19 PM  [facility] [address] [place], [place], [address] Phone: [phone]   Fax:  [phone]  Name: Emeline GinsMakenzie Knodel MRN: [id number] Date of Birth: 1995/05/21

## 2017-04-02 NOTE — Telephone Encounter (Signed)
I have a concerned mother and her child is experiencing depression and other symptoms. Pt. was admitted to the hospital and has been discharged. The hospital seems to think that the nexplanon may be causing her behavioral concerns and the mom would like it to be taken out as soon as possible. Also, she would like for her child to be placed on another birth control. Please give mom a call at your earliest convenience [phone]. Thanks! ° °

## 2017-04-02 NOTE — Telephone Encounter (Signed)
I have a concerned mother and her child is experiencing depression and other symptoms. Pt. was admitted to the hospital and has been discharged. The hospital seems to think that the nexplanon may be causing her behavioral concerns and the mom would like it to be taken out as soon as possible. Also, she would like for her child to be placed on another birth control. Please give mom a call at your earliest convenience [phone]. Thanks!

## 2017-04-03 ENCOUNTER — Other Ambulatory Visit (HOSPITAL_COMMUNITY): Payer: 59 | Admitting: Licensed Clinical Social Worker

## 2017-04-03 DIAGNOSIS — F333 Major depressive disorder, recurrent, severe with psychotic symptoms: Secondary | ICD-10-CM | POA: Diagnosis not present

## 2017-04-03 NOTE — Psych (Signed)
[facility] BH PHP THERAPIST PROGRESS NOTE  Virginia Bennett [id number]  Session Time: 9:00 - 10:30  Participation Level: Active  Behavioral Response: CasualAlertEuphoric  Type of Therapy: Group Therapy  Treatment Goals addressed: Coping  Interventions: CBT, DBT, Solution Focused, Supportive and Reframing  Summary: Clinician led check-in regarding current stressors and situation, and review of patient completed daily inventory. Clinician utilized active listening and empathetic response and validated patient emotions. Clinician facilitated processing group on pertinent issues.    Therapist Response: Virginia Bennett is a 22 y.o. female who presents with depression symptoms.  Patient arrived within time allowed and reports that she "woke up in a good mood." Patient rates her mood at a 7 on a scale of 1-10 with 10 being great. Pt reports that she had a sad evening concerning the loss of a close friend a few days ago. Pt stated that she was able to comfort one of her sisters and believes that this made her stronger. Pt reports she is still working on her not shutting down and being more honest. Patient engaged in discussion       Session Time: 10:30 -11:30   Participation Level: Active   Behavioral Response: CasualAlertDepressed   Type of Therapy: Group Therapy, OT   Treatment Goals addressed: Coping   Interventions: Psychosocial skills training, Supportive,    Summary:  Occupational Therapy group   Therapist Response: Patient engaged in group. See OT note.           Session Time: 11:30 - 12:15   Participation Level: Active   Behavioral Response: CasualAlertDepressed   Type of Therapy: Group Therapy, Psychotherapy; Psychoeducation   Treatment Goals addressed: Coping   Interventions: CBT, Solution focused, Supportive, Reframing   Summary: Cln led psychotherapy group on relationships and discussed ways to improve them.      Therapist Response: Patient engaged in  activity and discussion. Pt stated that she is going to be assertive and ask for space towards the individual that she "loves". Pt shared that she knows she can fully be herself with the individual that she "likes" and is going to work on this relationship doing this time period. Pt was able to identify whether she had or did not have the three components of a healthy relationship between these two individuals. Pt shared that she is going to work on being more assertive in her communication with others       Session Time: 12:15 - 1:00  Participation Level: Active  Behavioral Response: CasualAlertDepressed  Type of Therapy: Group Therapy, Activity Therapy  Treatment Goals addressed: Coping  Interventions: Psychologist, occupational, Supportive  Summary:  Reflection Group: Patients encouraged to practice skills and interpersonal techniques or work on mindfulness and relaxation techniques. The importance of self-care and making skills part of a routine to increase usage were stressed   Therapist Response: Patient engaged and participated.       Session Time: 12:45- 2:00  Participation Level: Active  Behavioral Response: CasualAlertDepressed  Type of Therapy: Group Therapy, Psychoeducation; Psychotherapy  Treatment Goals addressed: Coping  Interventions: CBT; Solution focused; Supportive; Reframing  Summary: 12:45 - 1:50: Cln led art therapy group on self esteem. Patients used creativity to create what self-esteem looks like for them now and what they want self-esteem to look like in the future.  1:50 -2:00 Clinician led check-out. Clinician assessed for immediate needs, medication compliance and efficacy, and safety concerns   Therapist Response: Pt engaged in activity and discussion. Pt shared that her self-esteem now  is still a work in progress and that she is stuck in time. Pt would like to see her self esteem grow into being okay with being different and overall happy again.   At check-out, patient rates her mood at a 8 on a scale of 1-10 with 10 being great. Pt reports that she has a doctor's appointment after group and plans on therapeutically coloring and watching Medical illustratorGreys Anatomy tonight. Patient demonstrates some progress as evidenced by using distracting skills from bad habits she may have. Patient denies SI/HI/self-harm thoughts at the end of group.     Suicidal/Homicidal: Nowithout intent/plan  Plan: Pt will continue in PHP with medication management while continuing to address managing psychosis, decreasing depression symptoms, and increasing ability to manage symptoms.  Diagnosis: MDD (major depressive disorder), recurrent, severe, with psychosis ([facility]) [F33.3]    1. MDD (major depressive disorder), recurrent, severe, with psychosis ([facility])       Virginia GuilesJenny Kent Riendeau, LCSW 04/03/2017

## 2017-04-06 ENCOUNTER — Other Ambulatory Visit (HOSPITAL_COMMUNITY): Payer: 59 | Admitting: Licensed Clinical Social Worker

## 2017-04-06 DIAGNOSIS — F333 Major depressive disorder, recurrent, severe with psychotic symptoms: Secondary | ICD-10-CM | POA: Diagnosis not present

## 2017-04-06 NOTE — Psych (Signed)
[facility] PHP THERAPIST PROGRESS NOTE  Virginia Bennett [id number]  Session Time: 9:00 - 10:30  Participation Level: Minimal  Behavioral Response: CasualAlertDepressed  Type of Therapy: Group Therapy  Treatment Goals addressed: Coping  Interventions: CBT, DBT, Solution Focused, Supportive and Reframing  Summary: Clinician led check-in regarding current stressors and situation, and review of patient completed daily inventory. Clinician utilized active listening and empathetic response and validated patient emotions. Clinician facilitated processing group on pertinent issues.    Therapist Response: Virginia Bennett is a 22 y.o. female who presents with depression symptoms.  Patient arrived within time allowed and reports that "I do not know how I am feeling." Patient rates her mood at a 3 on a scale of 1-10 with 10 being great. Pt reports that she had an "alright" evening and would not go into detail about what she did. Pt reports she is still working on utilizing the coping skills learned in group outside of group. Patient engaged in activity and discussion.      Session Time: 10:30 -11:15 ? Participation Level:?Active ? Behavioral Response:?CasualAlertDepressed ? Type of Therapy: Group Therapy, psychotherapy ? Treatment Goals addressed: Coping ? Interventions:?CBT, supportive, reframing ? Summary:?Cln continued topic of self esteem. Group worked on Scientist, water quality"Strengths and Qualities" handout and discussed barriers to recognizing what they do well.  ? Therapist Response: Patient engaged in activity and discussion. Patient reported that she doesn't think she had horrible self-esteem but it could be better. Patient shared that her esteem worsens or improves depending on who she is around.   ? ? ? ? Session Time: 11:15 - 12:00 ? Participation Level:?Active ? Behavioral Response:?CasualAlertDepressed ? Type of Therapy: Group Therapy, Psychotherapy; Psychoeducation ? Treatment  Goals addressed: Coping ? Interventions:?DBT, Solution focused, Supportive, Reframing ? Summary: Cln continued topic of self esteem. Cln educated group on the FAST skill as a means to bolster self esteem. Group discussed difficulties they foresee using the skill. ? Therapist Response: Patient engaged in group. Pt reports understanding of the FAST skill and how to use it.  ? ?  ? Session Time: 12:00- 1:00 ? Participation Level: Active ? Behavioral Response: CasualAlertDepressed ? Type of Therapy: Group Therapy, Psychoeducation; Psychotherapy ? Treatment Goals addressed: Coping ? Interventions: DBT, CBT; Solution focused; Supportive; Reframing ? Summary: 12:00 - 12:50: Group viewed "The Person You Really Need to Marry" TED talk and discussed why it is difficult to love ourselves even while we love others.  12:50 -1:00 Clinician led check-out. Clinician assessed for immediate needs, medication compliance and efficacy, and safety concerns   Therapist Response:  Patient engaged in activity and discussion. Patient identified her struggles with self-love and stated that owning up to her "weirdness" is how she gets through difficult times.  At check-out, patient rates her mood at a 5 on a scale of 1-10 with 10 being great. Patient reports that she is going to relax this weekend with friends and family. Patient demonstrates some progress as evidenced by coloring in her adult coloring book during group to help calm her down when the environment gets overwhelming. Patient denies SI/HI/self-harm at the end of group.      Suicidal/Homicidal: Nowithout intent/plan  Plan: Pt will continue in PHP with medication management while continuing to address managing psychosis, decreasing depression symptoms, and increasing ability to manage symptoms.  Diagnosis: MDD (major depressive disorder), recurrent, severe, with psychosis (H[facility]) [F33.3]    1. MDD (major depressive disorder), recurrent, severe,  with psychosis ([facility])       Boneta LucksJenny  Lavan Imes, LCSW 04/06/2017

## 2017-04-07 ENCOUNTER — Encounter (HOSPITAL_COMMUNITY): Payer: Self-pay | Admitting: Occupational Therapy

## 2017-04-07 ENCOUNTER — Other Ambulatory Visit (HOSPITAL_COMMUNITY): Payer: 59 | Admitting: Licensed Clinical Social Worker

## 2017-04-07 ENCOUNTER — Other Ambulatory Visit (HOSPITAL_COMMUNITY): Payer: 59 | Admitting: Occupational Therapy

## 2017-04-07 DIAGNOSIS — R4589 Other symptoms and signs involving emotional state: Secondary | ICD-10-CM

## 2017-04-07 DIAGNOSIS — F333 Major depressive disorder, recurrent, severe with psychotic symptoms: Secondary | ICD-10-CM | POA: Diagnosis not present

## 2017-04-07 NOTE — Therapy (Signed)
[facility] PARTIAL HOSPITALIZATION PROGRAM [address] [place], [place], [address] Phone: [phone]   Fax:  [phone]  Occupational Therapy Treatment  Patient Details  Name: Virginia Bennett MRN: [id number] Date of Birth: [birth year] Referring Provider: Dr. Lucianne Muss   Encounter Date: 04/07/2017  OT End of Session - 04/07/17 1711    Visit Number  4    Number of Visits  8    Date for OT Re-Evaluation  04/24/17    Authorization Type  cigna/cigna behavioral health    OT Start Time  1030    OT Stop Time  1130    OT Time Calculation (min)  60 min    Activity Tolerance  Patient tolerated treatment well    Behavior During Therapy  [facility] for tasks assessed/performed       Past Medical History:  Diagnosis Date  . ADHD (attention deficit hyperactivity disorder)   . GERD (gastroesophageal reflux disease)   . Learning disability    history of IEP beginning in 5th grade  . Vitamin D deficiency disease     Past Surgical History:  Procedure Laterality Date  . 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  Subjective Assessment - 04/07/17 1711    Currently in Pain?  No/denies         [facility] OT Assessment - 04/07/17 1710      Assessment   Medical Diagnosis  MDD      Precautions   Precautions  None         OT Group: Time Management  S: "I feel like I don't have enough time to get things done."   O: Time management session completed with emphasis on skills required, effective versus ineffective time management, importance of structure, along with tips and strategies for success. Patient provided with education on the effects of time management in regards to improving physical, emotional, and social well-being including improved ability for focus, decision-making skills, success in work, and social commitments, as well as benefits of reduced stress and  the experience of greater success in all aspects of daily life.  A: Pt participated in time management occupational therapy treatment session this date. Pt actively engaged in Minute-to-Win-it activities focusing on team building, communication, and time management strategies. Pt also participated in timed puzzle activity with focus on time management and looking at the "big picture" in correlation to a daily/weekly/monthly schedule being the "big picture" of planning and managing one's time. Pt actively engaged in discussion regarding benefits of time mangement and how this can assist in handling MH crisis and reducing stress. Pt participated in problem solving on planning and working towards small goals. Pt provided with handout outlining 10 strategies for improved time management.   P: Pt provided with time management strategies to implement during her daily schedule to assist in improving her balance between self-care, work, leisure, and PHP program tasks. OT will follow up with pt on success or struggles with implementation of learned strategies in 2 days.                   OT Short Term Goals - 04/01/17 1737      OT SHORT TERM GOAL #1   Title  Patient will be educated on strategies to improve psychosocial skills needed to participate fully in all daily, work, and leisure activities.  Time  3    Period  Weeks    Status  On-going      OT SHORT TERM GOAL #2   Title  Patient will be educated and independent with HEP.    Time  3    Period  Weeks    Status  On-going      OT SHORT TERM GOAL #3   Title   Patient will independently apply psychosocial skills and coping mechanisms to her daily activities in order to function independently    Time  3    Period  Weeks    Status  On-going               Plan - 04/07/17 1711    OT Treatment/Interventions  Self-care/ADL training;Psychosocial skills training;Coping strategies training       Patient will benefit from  skilled therapeutic intervention in order to improve the following deficits and impairments:  Decreased coping skills, Decreased psychosocial skills  Visit Diagnosis: MDD (major depressive disorder), recurrent, severe, with psychosis (HCC)  Difficulty coping    Problem List Patient Active Problem List   Diagnosis Date Noted  . MDD (major depressive disorder), recurrent episode (HCC) 03/27/2017  . Adjustment disorder, unspecified 03/19/2017  . MDD (major depressive disorder), single episode, severe with psychosis (HCC) 03/18/2017  . Surveillance of implantable subdermal contraceptive 04/07/2014  . History of ADHD 04/07/2014  . BMI (body mass index), pediatric, greater than or equal to 95% for age 41/10/2014  . Behavior causing concern in biological child 03/01/2014  . Acne vulgaris 03/01/2014  . Slow transit constipation 03/01/2014    Ezra SitesLeslie Ediberto Sens, OTR/L  [phone] 04/07/2017, 5:11 PM  [facility] PART[facility] PROGRAM [address] [place], [place], [address] Phone: [phone]   Fax:  [phone]  Name: Virginia Bennett MRN: [id number] Date of Birth: [birth year]

## 2017-04-08 ENCOUNTER — Ambulatory Visit (INDEPENDENT_AMBULATORY_CARE_PROVIDER_SITE_OTHER): Payer: Managed Care, Other (non HMO) | Admitting: Pediatrics

## 2017-04-08 ENCOUNTER — Other Ambulatory Visit (HOSPITAL_COMMUNITY): Payer: 59 | Admitting: Licensed Clinical Social Worker

## 2017-04-08 VITALS — BP 116/77 | HR 90 | Ht 64.96 in | Wt 192.4 lb

## 2017-04-08 DIAGNOSIS — Z23 Encounter for immunization: Secondary | ICD-10-CM | POA: Diagnosis not present

## 2017-04-08 DIAGNOSIS — Z3046 Encounter for surveillance of implantable subdermal contraceptive: Secondary | ICD-10-CM

## 2017-04-08 DIAGNOSIS — Z30011 Encounter for initial prescription of contraceptive pills: Secondary | ICD-10-CM

## 2017-04-08 DIAGNOSIS — F323 Major depressive disorder, single episode, severe with psychotic features: Secondary | ICD-10-CM

## 2017-04-08 DIAGNOSIS — F333 Major depressive disorder, recurrent, severe with psychotic symptoms: Secondary | ICD-10-CM

## 2017-04-08 DIAGNOSIS — N730 Acute parametritis and pelvic cellulitis: Secondary | ICD-10-CM | POA: Diagnosis not present

## 2017-04-08 DIAGNOSIS — Z113 Encounter for screening for infections with a predominantly sexual mode of transmission: Secondary | ICD-10-CM

## 2017-04-08 MED ORDER — PRENATAL 27-1 MG PO TABS
1.0000 | ORAL_TABLET | Freq: Every day | ORAL | 11 refills | Status: DC
Start: 2017-04-08 — End: 2018-07-29

## 2017-04-08 MED ORDER — DOXYCYCLINE HYCLATE 100 MG PO CAPS: 100.0000 mg | ORAL_CAPSULE | Freq: Two times a day (BID) | ORAL | 0 refills | Status: DC

## 2017-04-08 MED ORDER — NORETHIN ACE-ETH ESTRAD-FE 1.5-30 MG-MCG PO TABS: 1.0000 | ORAL_TABLET | Freq: Every day | ORAL | 11 refills | Status: DC

## 2017-04-08 MED ORDER — AZITHROMYCIN 500 MG PO TABS
1000.0000 mg | ORAL_TABLET | Freq: Once | ORAL | Status: AC
  Administered 2017-04-08: 1000 mg via ORAL

## 2017-04-08 MED ORDER — CEFTRIAXONE SODIUM 250 MG IJ SOLR
250.0000 mg | Freq: Once | INTRAMUSCULAR | Status: AC
  Administered 2017-04-08: 250 mg via INTRAMUSCULAR

## 2017-04-08 NOTE — Patient Instructions (Addendum)
Follow-up with Dr. Marina Goodell in 1 month. Schedule this appointment before you leave clinic today.  Your Nexplanon was removed today and is no longer preventing pregnancy.  If you have sex, remember to use condoms to prevent pregnancy and to prevent sexually transmitted infections.  Leave the outside bandage on for 24 hours.  Leave the smaller bandages on for 3-5 days or until they fall off on their own.  Keep the area clean and dry for 3-5 days.  There is usually bruising or swelling at and around the removal site for a few days to a week after the removal.  If you see redness or pus draining from the removal site, call us immediately.  We would like you to return to the clinic for a follow-up visit in 1 month.  You can call [facility] 24 hours a day with any questions or concerns.  There is always a nurse or doctor available to take your call.  Call 9-1-1 if you have a life-threatening emergency.  For anything else, please call us at [phone] before heading to the ER. Pelvic Inflammatory Disease Pelvic inflammatory disease (PID) is an infection in some or all of the female organs. PID can be in the uterus, ovaries, fallopian tubes, or the surrounding tissues that are inside the lower belly area (pelvis). PID can lead to lasting problems if it is not treated. To check for this disease, your doctor may:  Do a physical exam.  Do blood tests, urine tests, or a pregnancy test.  Look at your vaginal discharge.  Do tests to look inside the pelvis.  Test you for other infections.  Follow these instructions at home:  Take over-the-counter and prescription medicines only as told by your doctor.  If you were prescribed an antibiotic medicine, take it as told by your doctor. Do not stop taking it even if you start to feel better.  Do not have sex until treatment is done or as told by your doctor.  Tell your sex partner if you have PID. Your partner may need to be  treated.  Keep all follow-up visits as told by your doctor. This is important.  Your doctor may test you for infection again 3 months after you are treated. Contact a doctor if:  You have more fluid (discharge) coming from your vagina or fluid that is not normal.  Your pain does not improve.  You throw up (vomit).  You have a fever.  You cannot take your medicines.  Your partner has a sexually transmitted disease (STD).  You have pain when you pee (urinate). Get help right away if:  You have more belly (abdominal) or lower belly pain.  You have chills.  You are not better after 72 hours. This information is not intended to replace advice given to you by your health care provider. Make sure you discuss any questions you have with your health care provider. Document Released: 04/04/2008 Document Revised: 06/14/2015 Document Reviewed: 02/13/2014 Elsevier Interactive Patient Education  2018 [employer].  Oral Contraception Use Oral contraceptive pills (OCPs) are medicines taken to prevent pregnancy. OCPs work by preventing the ovaries from releasing eggs. The hormones in OCPs also cause the cervical mucus to thicken, preventing the sperm from entering the uterus. The hormones also cause the uterine lining to become thin, not allowing a fertilized egg to attach to the inside of the uterus. OCPs are highly effective when taken exactly as prescribed. However, OCPs do not prevent sexually  transmitted diseases (STDs). Safe sex practices, such as using condoms along with an OCP, can help prevent STDs. Before taking OCPs, you may have a physical exam and Pap test. Your health care provider may also order blood tests if necessary. Your health care provider will make sure you are a good candidate for oral contraception. Discuss with your health care provider the possible side effects of the OCP you may be prescribed. When starting an OCP, it can take 2 to 3 months for the body to adjust to the  changes in hormone levels in your body. How to take oral contraceptive pills Your health care provider may advise you on how to start taking the first cycle of OCPs. Otherwise, you can:  Start on day 1 of your menstrual period. You will not need any backup contraceptive protection with this start time.  Start on the first Sunday after your menstrual period or the day you get your prescription. In these cases, you will need to use backup contraceptive protection for the first week.  Start the pill at any time of your cycle. If you take the pill within 5 days of the start of your period, you are protected against pregnancy right away. In this case, you will not need a backup form of birth control. If you start at any other time of your menstrual cycle, you will need to use another form of birth control for 7 days. If your OCP is the type called a minipill, it will protect you from pregnancy after taking it for 2 days (48 hours).  After you have started taking OCPs:  If you forget to take 1 pill, take it as soon as you remember. Take the next pill at the regular time.  If you miss 2 or more pills, call your health care provider because different pills have different instructions for missed doses. Use backup birth control until your next menstrual period starts.  If you use a 28-day pack that contains inactive pills and you miss 1 of the last 7 pills (pills with no hormones), it will not matter. Throw away the rest of the non-hormone pills and start a new pill pack.  No matter which day you start the OCP, you will always start a new pack on that same day of the week. Have an extra pack of OCPs and a backup contraceptive method available in case you miss some pills or lose your OCP pack. Follow these instructions at home:  Do not smoke.  Always use a condom to protect against STDs. OCPs do not protect against STDs.  Use a calendar to mark your menstrual period days.  Read the information and  directions that came with your OCP. Talk to your health care provider if you have questions. Contact a health care provider if:  You develop nausea and vomiting.  You have abnormal vaginal discharge or bleeding.  You develop a rash.  You miss your menstrual period.  You are losing your hair.  You need treatment for mood swings or depression.  You get dizzy when taking the OCP.  You develop acne from taking the OCP.  You become pregnant. Get help right away if:  You develop chest pain.  You develop shortness of breath.  You have an uncontrolled or severe headache.  You develop numbness or slurred speech.  You develop visual problems.  You develop pain, redness, and swelling in the legs. This information is not intended to replace advice given to you by your  health care provider. Make sure you discuss any questions you have with your health care provider. Document Released: 12/26/2010 Document Revised: 06/14/2015 Document Reviewed: 06/27/2012 Elsevier Interactive Patient Education  2017 [employer].

## 2017-04-08 NOTE — Psych (Signed)
   [facility] BH PHP THERAPIST PROGRESS NOTE  [name] [id number]  Session Time: 9:00 -10:30   Participation Level: Minimal   Behavioral Response: CasualAlertDepressed   Type of Therapy: Group Therapy, Medication group, psychoeducation   Treatment Goals addressed: Coping   Interventions: Medication education, Supportive   Summary:  Medication group: Pharmacist led group on psychotropic medications and common issues to be aware of. Pharmacist answered pt questions.    Therapist Response: Patient minimally engaged in group.      Session Time: 10:30 - 12:00  Participation Level: Minimal  Behavioral Response: CasualAlertDepressed  Type of Therapy: Group Therapy  Treatment Goals addressed: Coping  Interventions: CBT, DBT, Solution Focused, Supportive and Reframing  Summary: Clinician led check-in regarding current stressors and situation, and review of patient completed daily inventory. Clinician utilized active listening and empathetic response and validated patient emotions. Clinician facilitated processing group on pertinent issues.    Therapist Response: Virginia Bennett is a 22 y.o. female who presents with depression symptoms.  Patient arrived within time allowed and reports that she feels "better than not." Patient rates her mood at a 7 on a scale of 1-10 with 10 being great. Pt reports she had an altercation with a friend which stressed her out and spent time with family over the weekend. Pt reports she needs to continue working on managing impulses and putting skills to use. Patient engaged in activity and discussion.      Session Time: 12:00 - 12:45  Participation Level: Active  Behavioral Response: CasualAlertDepressed  Type of Therapy: Group Therapy, Activity Therapy  Treatment Goals addressed: Coping  Interventions: Psychologist, occupational, Supportive  Summary:  Reflection Group: Patients encouraged to practice skills and interpersonal techniques or  work on mindfulness and relaxation techniques. The importance of self-care and making skills part of a routine to increase usage were stressed   Therapist Response: Patient engaged and participated appropriately.       Session Time: 12:45- 2:00  Participation Level: Active  Behavioral Response: CasualAlertDepressed  Type of Therapy: Group Therapy, Psychoeducation; Psychotherapy  Treatment Goals addressed: Coping  Interventions: CBT; Solution focused; Supportive; Reframing  Summary: 12:45 - 1:50: Clinician introduced topic of cognitive distortions. Cln educated on what cognitive distortions are and how they affect [place]. Cln introduced "Catch, Challenge, Change" and group reviewed cognitive distortion handout and came up with examples to work on "catch." 1:50 -2:00 Clinician led check-out. Clinician assessed for immediate needs, medication compliance and efficacy, and safety concerns   Therapist Response: Patient engaged activity and discussion. Pt was able to identify real life examples of cognitive distortions discussed.  At check-out, patient rates her mood at a 7 on a scale of 1-10 with 10 being great. Patient reports no plans this afternoon.  Patient demonstrates some progress as evidenced by a stable mood and report of managing an impulsive action this weekend. Patient denies SI/HI/self-harm at the end of group.     Suicidal/Homicidal: Nowithout intent/plan  Plan: Pt will continue in PHP with medication management while continuing to address managing psychosis, decreasing depression symptoms, and increasing ability to manage symptoms.  Diagnosis: MDD (major depressive disorder), recurrent, severe, with psychosis (HCC) [F33.3]    1. MDD (major depressive disorder), recurrent, severe, with psychosis (HCC)       [name], LCSW 04/08/2017

## 2017-04-08 NOTE — Psych (Signed)
[facility] PHP THERAPIST PROGRESS NOTE  Virginia GinsMakenzie Claire [id number]  Session Time: 9:00 - 10:30  Participation Level: Active  Behavioral Response: CasualAlertDepressed  Type of Therapy: Group Therapy, Psychotherapy  Treatment Goals addressed: Coping  Interventions: CBT, DBT, Solution Focused, Supportive and Reframing  Summary: Clinician led check-in regarding current stressors and situation, and review of patient completed daily inventory. Clinician utilized active listening and empathetic response and validated patient emotions. Clinician facilitated processing group on pertinent issues.    Therapist Response: Virginia Bennett is a 22 y.o. female who presents with depression symptoms.  Patient arrived within time allowed and reports that she feels "middle-ish." Patient rates her mood at a 5 on a scale of 1-10 with 10 being great. Pt reports having a fight with her mom yesterday and having a thought of wanting to jump out of the car. Pt reports she texted someone to manage the feeling. Pt reports she needs to continue working on not shutting down. Patient engaged in activity and discussion.      Session Time: 10:30 -11:30   Participation Level: Active   Behavioral Response: CasualAlertDepressed   Type of Therapy: Group Therapy, OT   Treatment Goals addressed: Coping   Interventions: Psychosocial skills training, Supportive,    Summary:  Occupational Therapy group   Therapist Response: Patient engaged in group. See OT note.           Session Time: 11:30 - 12:15   Participation Level: Active   Behavioral Response: CasualAlertDepressed   Type of Therapy: Group Therapy, Psychotherapy; Psychoeducation   Treatment Goals addressed: Coping   Interventions: CBT, Solution focused, Supportive, Reframing   Summary: Cln led psychotherapy group on guilt. Group discussed reasons they felt guilty and what to do with those feelings as well as how to reframe.       Therapist  Response: Patient engaged in group. Pt reports feeling guilty often and that it normally triggers her to shut down.      Session Time: 12:15 - 1:00  Participation Level: Active  Behavioral Response: CasualAlertDepressed  Type of Therapy: Group Therapy, Activity Therapy  Treatment Goals addressed: Coping  Interventions: Psychologist, occupationalocial Skills Training, Supportive  Summary:  Reflection Group: Patients encouraged to practice skills and interpersonal techniques or work on mindfulness and relaxation techniques. The importance of self-care and making skills part of a routine to increase usage were stressed   Therapist Response: Patient engaged and participated.       Session Time: 12:45- 2:00  Participation Level: Active  Behavioral Response: CasualAlertDepressed  Type of Therapy: Group Therapy, Psychoeducation; Psychotherapy  Treatment Goals addressed: Coping  Interventions: CBT; Solution focused; Supportive; Reframing  Summary: 12:45 - 1:50: Cln continued topic of cognitive distortions. Group discussed how to "challenge" the unhealthy thought patterns once recognized. Group reviewed "Challenges to Negative Thinking" handout and went over handout with irrational thought examples and discussed how to challenge those thoughts.  1:50 -2:00 Clinician led check-out. Clinician assessed for immediate needs, medication compliance and efficacy, and safety concerns   Therapist Response: Patient engaged in activity. Pt states understanding of how to challenge unhealthy thoughts and successfully reframed examples in discussion.  At check-out, patient rates her mood at a 6 on a scale of 1-10 with 10 being great. Pt reports that she plans to eat goldfish and hang out this afternoon. Patient demonstrates some progress as evidenced by increased sleep and successfully using skills last night. Patient denies SI/HI/self-harm thoughts at the end of group.  Suicidal/Homicidal: Nowithout  intent/plan  Plan: Pt will continue in PHP with medication management while continuing to address managing psychosis, decreasing depression symptoms, and increasing ability to manage symptoms.  Diagnosis: MDD (major depressive disorder), recurrent, severe, with psychosis (HCC) [F33.3]    1. MDD (major depressive disorder), recurrent, severe, with psychosis (HCC)       [name], LCSW 04/08/2017

## 2017-04-08 NOTE — Progress Notes (Signed)
Spiritual care group 04/08/2017 11:00-12:00  Facilitated by Wilkie Ayehaplain Abbigal Radich, MDiv    Group focused on topic of "self-care"  Patients engaged in facilitated discussion about topic.  Explored quotes related to self care and chose one which they agreed with and one which they disliked.  Engaged in discussion around quote choices and their experience / understanding of care for themselves.    Virginia KyleMakenzie was present throughout group.  She engaged with group activity.  Described difficulty of letting go of feelings - stating when something happens to her she is aware of sadness and then becomes angry because she doesn't want this to affect her.  She describe boyfriend trying to help her "move on" but this makes her anger erupt - though she knows she would like to be able to let some things go.  Described self care as being able to care for herself in the way she cares for others.    Described feeling disappointed and angry when others don't give her the same care she offers them.    Recalled metaphor of gas tank and recognized that she drains her own gas tank helping others and has no reserves for herself.  Connected with other group members around some elements that encourage them to care for others in lieu of themselves.  Spoke of relearning behaviors and patterns.  [facility] Chaplain Burnis KingfisherMatthew [name], MDiv, [facility]

## 2017-04-09 ENCOUNTER — Encounter (HOSPITAL_COMMUNITY): Payer: Self-pay

## 2017-04-09 ENCOUNTER — Encounter (HOSPITAL_COMMUNITY): Payer: Self-pay | Admitting: Occupational Therapy

## 2017-04-09 ENCOUNTER — Other Ambulatory Visit (HOSPITAL_COMMUNITY): Payer: 59 | Admitting: Licensed Clinical Social Worker

## 2017-04-09 ENCOUNTER — Other Ambulatory Visit (HOSPITAL_COMMUNITY): Payer: 59 | Admitting: Occupational Therapy

## 2017-04-09 VITALS — BP 124/84 | HR 87 | Ht 65.5 in | Wt 195.0 lb

## 2017-04-09 DIAGNOSIS — F333 Major depressive disorder, recurrent, severe with psychotic symptoms: Secondary | ICD-10-CM

## 2017-04-09 DIAGNOSIS — R4589 Other symptoms and signs involving emotional state: Secondary | ICD-10-CM

## 2017-04-09 LAB — C. TRACHOMATIS/N. GONORRHOEAE RNA
C. trachomatis RNA, TMA: NOT DETECTED
N. gonorrhoeae RNA, TMA: NOT DETECTED

## 2017-04-09 LAB — WET PREP BY MOLECULAR PROBE
CANDIDA SPECIES: NOT DETECTED
MICRO NUMBER:: 90351409
SPECIMEN QUALITY:: ADEQUATE
Trichomonas vaginosis: NOT DETECTED

## 2017-04-09 LAB — TIQ- MISLABELED: TEST ORDERED ON REQ: 11363

## 2017-04-09 LAB — RPR: RPR Ser Ql: NONREACTIVE

## 2017-04-09 LAB — HIV ANTIBODY (ROUTINE TESTING W REFLEX): HIV: NONREACTIVE

## 2017-04-09 NOTE — Therapy (Signed)
[facility] PARTIAL HOSPITALIZATION PROGRAM [address] [place], [place], [address] Phone: [phone]   Fax:  [phone]  Occupational Therapy Treatment  Patient Details  Name: Virginia Bennett MRN: [id number] Date of Birth: Oct 06, 1995 Referring Provider: Dr. Lucianne Muss   Encounter Date: 04/09/2017  OT End of Session - 04/09/17 1627    Visit Number  5    Number of Visits  8    Date for OT Re-Evaluation  04/24/17    Authorization Type  cigna/cigna behavioral health    OT Start Time  1030    OT Stop Time  1130    OT Time Calculation (min)  60 min    Activity Tolerance  Patient tolerated treatment well    Behavior During Therapy  [facility] for tasks assessed/performed       Past Medical History:  Diagnosis Date  . ADHD (attention deficit hyperactivity disorder)   . GERD (gastroesophageal reflux disease)   . Learning disability    history of IEP beginning in 5th grade  . Vitamin D deficiency disease     Past Surgical History:  Procedure Laterality Date  . 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  Subjective Assessment - 04/09/17 1627    Currently in Pain?  No/denies         [facility] OT Assessment - 04/09/17 1627      Assessment   Medical Diagnosis  MDD      Precautions   Precautions  None          OT Group Session: Physical and Mental Health and Wellness:    S: "Ok."   O: Physical and mental health and wellness session completed with emphasis on connecting the two areas, effects of self-esteem and confidence building, how to improve physical and mental health, and goal setting. Patient provided with education on building positive physical and mental health practices to improve physical, emotional, and social well-being including exercise habits, nutrition, sleep, hand hygiene, and stress reduction.     A: Pt participated in physical and  mental health and wellness occupational therapy treatment session this date within the [facility] program.  Pt participated in group activity of "Pictionary" game focusing on illustrating various words or concepts connected to physical and mental health and wellness. Pt participated in discussion and was receptive to education on the links between physical health and mental health, mind and body connections, and identifying ways to improve upon current health habits affecting mental and physical health and wellness. Pt identified various strategies to combat depression and ways to improve current lifestyle habits. Strategies provided to assist in improving physical and mental health balance including exercise, nutrition, stress management, sleep habits, and recognizing when changes are necessary.    P: Continue participation in skilled occupational therapy groups 1-2 times per week for 2 weeks in order to gain the necessary skills needed to return to full time community living and learn effective coping strategies to be a productive community resident.                 OT Short Term Goals - 04/01/17 1737      OT SHORT TERM GOAL #1   Title  Patient will be educated on strategies to improve psychosocial skills needed to participate fully in all daily, work, and leisure activities.     Time  3  Period  Weeks    Status  On-going      OT SHORT TERM GOAL #2   Title  Patient will be educated and independent with HEP.    Time  3    Period  Weeks    Status  On-going      OT SHORT TERM GOAL #3   Title   Patient will independently apply psychosocial skills and coping mechanisms to her daily activities in order to function independently    Time  3    Period  Weeks    Status  On-going               Plan - 04/09/17 1627    OT Treatment/Interventions  Self-care/ADL training;Psychosocial skills training;Coping strategies training       Patient will benefit from skilled therapeutic  intervention in order to improve the following deficits and impairments:  Decreased coping skills, Decreased psychosocial skills  Visit Diagnosis: MDD (major depressive disorder), recurrent, severe, with psychosis (HCC)  Difficulty coping    Problem List Patient Active Problem List   Diagnosis Date Noted  . MDD (major depressive disorder), recurrent episode (HCC) 03/27/2017  . Adjustment disorder, unspecified 03/19/2017  . MDD (major depressive disorder), single episode, severe with psychosis (HCC) 03/18/2017  . Surveillance of implantable subdermal contraceptive 04/07/2014  . History of ADHD 04/07/2014  . BMI (body mass index), pediatric, greater than or equal to 95% for age 59/10/2014  . Behavior causing concern in biological child 03/01/2014  . Acne vulgaris 03/01/2014  . Slow transit constipation 03/01/2014   Ezra SitesLeslie Arnold Depinto, OTR/L  [phone] 04/09/2017, 4:28 PM  [facility] PART[facility] H[facility] PROGRAM [address] [place], [place], [address] Phone: [phone]   Fax:  [phone]  Name: [name] MRN: [id number] Date of Birth: Jul 22, 1995

## 2017-04-09 NOTE — Progress Notes (Signed)
THIS RECORD MAY CONTAIN CONFIDENTIAL INFORMATION THAT SHOULD NOT BE RELEASED WITHOUT REVIEW OF THE SERVICE PROVIDER.  Adolescent Medicine Consultation Follow-Up Visit Virginia Bennett  is a 22 y.o. female referred by [facility] here today for follow-up regarding removal of nexplanon.    Last seen in Adolescent Medicine Clinic on 06/20/2016 for btb on nexplanon and vaginal discharge.  Plan at last visit included Take junel X2 months for BTB, check cbc, ferritin, tsh.  Take flagy BID X7 days for vaginal d/c.Marland Kitchen  Pertinent Labs? Yes Growth Chart Viewed? yes   History was provided by the patient and mother.  Interpreter? no    Chief Complaint  Patient presents with  . Follow-up    HPI:    Patient seen in ED on 2/272019 for SI w/ attempt.  Reports she attempted to drown self in bathtub and when she was unable to she attempted to cut her wrist with a "dull knife".  Mom came home while she was trying to cut her wrist. Was admitted for a week and then was transferred to partial hospitalization where she is seeing a therapist daily.    Event that seemed to trigger this most recent episode was finding out that her "go to" (intamite partner) had gotten another women pregnant.  Had a 10 day period of "being in a very dark place".  During this period reports she was hearing voices telling her to hurt herself.  Increased marijuana use during this period (5 "blunts" a day as opposed to 1).  Stopped eating and isolated herself.    Reports family history of bipolar on both sides of family.    Being seen today for removal of Nexplanon that was most recently placed 04/2016.  This was a replacement for a device that was in her arm for 2 years that was removed because it had shifted in her arm and appeared bent.  She is requesting removal because she believes the devices is causing her depression and that it is causing her recurrent BV.  She states that the nurses from her inpatient visit also  believe that it may be adding to her depression.    Additionally, she believes that she has BV again.  Reports she keeps getting.  Reports she has always taken full course of abx when rx'd and BV has resolved while taking abx.  She does report having unprotected sex with her "go to" and that he has un-protected sex with multiple other women.  LMP: was in February w/ regular bleeding.     No LMP recorded. Allergies  Allergen Reactions  . Aspirin     Father & Siblings are allergic. So Mom doesn't even give to patient.   Outpatient Medications Prior to Visit  Medication Sig Dispense Refill  . clonazePAM (KLONOPIN) 0.5 MG tablet Take 1 tablet (0.5 mg total) by mouth 2 (two) times daily. 60 tablet 0  . pantoprazole (PROTONIX) 40 MG tablet Take 1 tablet (40 mg total) by mouth every evening. For acid reflux 30 tablet 0  . QUEtiapine (SEROQUEL) 100 MG tablet Take 1 tablet (100 mg total) by mouth at bedtime. 90 tablet 0  . gabapentin (NEURONTIN) 100 MG capsule Take 1 capsule (100 mg total) by mouth 2 (two) times daily. Withdrawal symptoms (Patient not taking: Reported on 04/01/2017) 60 capsule 0  . hydrOXYzine (ATARAX/VISTARIL) 25 MG tablet Take 1 tablet (25 mg total) by mouth 3 (three) times daily as needed for anxiety. (Patient not taking: Reported on 04/01/2017) 30 tablet  0   No facility-administered medications prior to visit.      Patient Active Problem List   Diagnosis Date Noted  . MDD (major depressive disorder), recurrent episode (HCC) 03/27/2017  . Adjustment disorder, unspecified 03/19/2017  . MDD (major depressive disorder), single episode, severe with psychosis (HCC) 03/18/2017  . Surveillance of implantable subdermal contraceptive 04/07/2014  . History of ADHD 04/07/2014  . BMI (body mass index), pediatric, greater than or equal to 95% for age 65/10/2014  . Behavior causing concern in biological child 03/01/2014  . Acne vulgaris 03/01/2014  . Slow transit constipation 03/01/2014     Social History: Changes with school since last visit?  no, reports she is attending [employer] online with goal of completing high school.  Activities:  Special interests/hobbies/sports: listening to music, watching tv, hanging out w/ friends.  Lifestyle habits that can impact QOL: Sleep:prefers to sleep during the day and stays up at night Eating habits/patterns: Not on a diet but has lost 10 pounds.   Exercise: No exerciese   Confidentiality was discussed with the patient and if applicable, with caregiver as well.  Changes at home or school since last visit:  no  Gender identity: Female Sex assigned at birth: Female Pronouns: she Tobacco?  Rolls marijuana in cigar wrapper Drugs/ETOH?  yes, marijuana daily (but reports she is smoking less) Partner preference?  female  Sexually Active?  yes, unprotected sex with one partner who has unprotected sex with multiple partners.  Has stopped having sex with this person recently d/t him getting another girl pregnant.  Pregnancy Prevention:  implant Reviewed condoms:  yes, reports she thinks she is allergic to latex.  Discussed options to include maybe she is sensitive to spermacide.  Advised trying different lubricants or brands of condoms.  Also non-latex condoms.   Reviewed EC:  no   Suicidal or homicidal thoughts?   no, not currently. Self injurious behaviors?  no     The following portions of the patient's history were reviewed and updated as appropriate: allergies, current medications, past family history, past medical history, past social history, past surgical history and problem list.  Physical Exam:  Vitals:   04/08/17 1405  BP: 116/77  Pulse: 90  Weight: 192 lb 6.4 oz (87.3 kg)  Height: 5' 4.96" (1.65 m)   BP 116/77   Pulse 90   Ht 5' 4.96" (1.65 m)   Wt 192 lb 6.4 oz (87.3 kg)   BMI 32.06 kg/m  Body mass index: body mass index is 32.06 kg/m. Blood pressure percentiles are not available for patients who are 18  years or older.  Review of Systems  Constitutional: Positive for weight loss. Negative for chills, diaphoresis, fever and malaise/fatigue.  HENT: Negative for congestion, ear discharge, ear pain, hearing loss, nosebleeds, sinus pain and sore throat.   Eyes: Negative for blurred vision.  Respiratory: Negative for cough, shortness of breath and wheezing.   Cardiovascular: Negative for chest pain, palpitations and orthopnea.  Gastrointestinal: Negative for abdominal pain, blood in stool, constipation, diarrhea, heartburn, nausea and vomiting.  Genitourinary: Negative for dysuria, frequency and urgency.       Reports fishy smelling white discharge from vagina.    Musculoskeletal: Negative for back pain and joint pain.  Skin: Negative for itching and rash.  Neurological: Negative for dizziness, tingling, tremors and headaches.  Endo/Heme/Allergies: Does not bruise/bleed easily.  Psychiatric/Behavioral: Positive for depression, hallucinations and substance abuse. Negative for suicidal ideas. The patient is nervous/anxious.  Physical Exam  Constitutional: She is oriented to person, place, and time. She appears well-developed and well-nourished.  HENT:  Head: Normocephalic and atraumatic.  Right Ear: External ear normal.  Left Ear: External ear normal.  Nose: Nose normal.  Mouth/Throat: Oropharynx is clear and moist. No oropharyngeal exudate.  Eyes: Pupils are equal, round, and reactive to light. Conjunctivae and EOM are normal. Right eye exhibits no discharge. Left eye exhibits no discharge. No scleral icterus.  Neck: Normal range of motion. Neck supple. No tracheal deviation present. No thyromegaly present.  Cardiovascular: Normal rate, regular rhythm and normal heart sounds. Exam reveals no gallop and no friction rub.  No murmur heard. Pulmonary/Chest: Effort normal and breath sounds normal. No stridor. No respiratory distress. She has no wheezes. She has no rales. She exhibits no  tenderness.  Abdominal: Soft. Bowel sounds are normal. She exhibits no distension. There is tenderness. There is guarding.  Tenderness of RUQ, LUQ, epigastric, and suprapubic tenderness.   Genitourinary: No labial fusion. There is no rash, tenderness, lesion or injury on the right labia. There is no rash, tenderness, lesion or injury on the left labia. Cervix exhibits motion tenderness and discharge. Cervix exhibits no friability. Right adnexum displays no mass, no tenderness and no fullness. Left adnexum displays no mass, no tenderness and no fullness. Vaginal discharge found.  Musculoskeletal: Normal range of motion. She exhibits no edema, tenderness or deformity.  Lymphadenopathy:    She has no cervical adenopathy.  Neurological: She is alert and oriented to person, place, and time. She has normal reflexes. Coordination normal.  Skin: Skin is warm and dry. No rash noted. No erythema. No pallor.  Psychiatric:  Cooperative and pleasant during exam.  Did have trouble with remembering things.  Had flight of ideas during interview.     Assessment/Plan: 1. Encounter for Nexplanon removal Patient acknowledged that depression and BV are not from nexplanon, but during discussion reported that her mom made her get the nexplanon when she was younger and that she never wanted it.  Reports that she wants to usee OCP.  Reviewed multiple other options and she declined all of those options.  Denied any history of headache w/ aura.  Stressed the importance of taking OCP daily and the increased risk of pregnancy over the nexplanon.  Stressed condom use.    2. MDD (major depressive disorder), single episode, severe with psychosis (HCC) Denies any current SI/HI.  Discussed points of contact and calling 911 when feeling suicidal.  Is seeing therpaist daily as part of partial hospitalization.    3. Encounter for initial prescription of contraceptive pills Started on Junel FE 1.5/30 daily.  Advised that she is  not currently protected from pregnancy and must use condom or abstain from sex for next 7 days. Also advised her to let psychiatrist no that she has started OCP and how they can interact with many psych meds.     - norethindrone-ethinyl estradiol-iron (JUNEL FE 1.5/30) 1.5-30 MG-MCG tablet; Take 1 tablet by mouth daily. (Patient not taking: Reported on 04/09/2017)  Dispense: 1 Package; Refill: 11 - PRENATAL 27-1 MG TABS; Take 1 tablet by mouth daily.  Dispense: 30 each; Refill: 11  4. PID (acute pelvic inflammatory disease)   Reports pain has been there for a while is not limited activity, denies N/V/D or fever, and it does not awaken her from sleep.  Reports daily soft bowel movements.  Does have foul smelling white discharge, Cervical motion tenderness, suprapubic, and right sided abdominal pain  on palpation.  1 gm of Azithromycin and 250mg  of Ceftriaxone taken in clinic.  Rx'd Doxycycline 100mg  BID for 2 weeks.  Advised to complete full course of abx.    - cefTRIAXone (ROCEPHIN) injection 250 mg - azithromycin (ZITHROMAX) tablet 1,000 mg - doxycycline (VIBRAMYCIN) 100 MG capsule; Take 1 capsule (100 mg total) by mouth 2 (two) times daily.  Dispense: 28 capsule; Refill: 0  5. Routine screening for STI (sexually transmitted infection) As above. - C. trachomatis/N. gonorrhoeae RNA - WET PREP BY MOLECULAR PROBE - HIV antibody - RPR  6. Need for vaccination Patient requested both vaccines. - HPV 9-valent vaccine,Recombinat - Flu Vaccine QUAD 36+ mos IM   Follow-up:  8 weeks  Medical decision-making:  >40 minutes spent face to face with patient with more than 50% of appointment spent discussing diagnosis, management, follow-up, and reviewing of PID, nexplanon removal, MDD, other contraception, vaccines.

## 2017-04-09 NOTE — Progress Notes (Signed)
Patient presents with brighter affect, pleasant mood and reported still some depression and anxiety but better managed with Seroquel medication.  Patient rated her current level of depression a 7, anxiety a 6, and hopelessness a 3-4 on a scale of 0-10 with 0 being none and 10 the worst she could manage.  Patient denied any suicidal or homicidal ideations, no plan or intent to harm self or others and denied any auditory or visual hallucinations.  Patient acknowledged already feeling "better" now that she is office implanted birth control and states plan to start oral contraceptive newly prescribed medication once she has a menstrual cycle.  Patient reported her primary concern about Seroquel was that she has gained 14 pounds since starting it 03/26/17, as she was then 181 pounds and today is 195 pounds.  Patient reported increased appetite and discussed dietary changes and exercises that could help with this but also agreed to inform Hillery Jacksanika Lewis, NP so they could discuss further when they meet on 04/10/17.  Patient reported feeling much improved and admitted still using her Clonazepam to assist with anxiety.  States at times she takes 2 at a time but never more than 2 in a day when she is really anxious.  Cautioned patient about overtaking this benzodiazepine and patient stated understanding concern.  Patient reported sleeping a lot, as naps when gets home in the evening from the program and then goes back to sleep at night for a total of at least 10-12 hours a day.  Patient reported things better at home and reported plan and desire to continue with PHP until completes the program in the coming week.  Patient agreed to keep this nurse informed if any changes or worsening of symptoms and agreed to follow up with NP on tomorrow regarding weight concern since starting Seroquel.

## 2017-04-09 NOTE — Psych (Signed)
   [facility]  Virginia GinsMakenzie Gasparro [phone]  Session Time: 9:00 - 10:45  Participation Level: Active  Behavioral Response: CasualAlertDepressed  Type of Therapy: Group Therapy, Psychotherapy  Treatment Goals addressed: Coping  Interventions: CBT, DBT, Solution Focused, Supportive and Reframing  Summary: Clinician led check-in regarding current stressors and situation, and review of patient completed daily inventory. Clinician utilized active listening and empathetic response and validated patient emotions. Clinician facilitated processing group on pertinent issues.    Therapist Response: Virginia Bennett is a 22 y.o. female who presents with depression symptoms. Patient arrived within time allowed and reports that "I don't know how I feel." Patient rates her mood at a 3 on a scale of 1-10 with 10 being best. Pt reports that she slept a lot yesterday afternoon. Patient stated that she got into an argument "with several people" yesterday. Pt did not want to discuss the arguments in detail. Pt shared that she thought about hurting the people she was in arguments and instead walked away from them. Pt reports that she is still working on how to control her emotions when they arise. Pt engaged in discussion.       Session Time: 10:45 -12:15  Participation Level: Active  Behavioral Response: CasualAlertDepressed  Type of Therapy: Group Therapy  Treatment Goals addressed: Coping  Interventions: Strengths based, reframing, Supportive,   Summary:  Spiritual Care group  Therapist Response: Patient engaged in group. See chaplain note.         Session Time: 12:15 - 1:00  Participation Level: Active  Behavioral Response: CasualAlertDepressed  Type of Therapy: Group Therapy, Activity Therapy  Treatment Goals addressed: Coping  Interventions: Psychologist, occupationalocial Skills Training, Supportive  Summary:  Reflection Group: Patients encouraged to practice skills and  interpersonal techniques or work on mindfulness and relaxation techniques. The importance of self-care and making skills part of a routine to increase usage were stressed   Therapist Response: Patient engaged and participated appropriately.        Session Time: 1:00- 2:00  Participation Level: Did not attend  Behavioral Response: CasualAlertDepressed  Type of Therapy: Group Therapy, Psychoeducation  Treatment Goals addressed: Coping  Interventions: relaxation training; Supportive; Reframing  Summary: 1:00 - 1:50: Relaxation group: Cln Forde RadonLeanne Yates led yoga group focused on retraining the body's response to stress.   1:50 -2:00 Clinician led check-out. Clinician assessed for immediate needs, medication compliance and efficacy, and safety concerns    Therapist Response:  **Pt chose to leave at 1 due to a doctor's appointment. Pt stated that she felt good about coming back to group tomorrow upon leaving. Pt reported no SI/HI/self-harm at the end of group.     Suicidal/Homicidal: Nowithout intent/plan  Plan: Pt will continue in PHP with medication management while continuing to address managing psychosis, decreasing depression symptoms, and increasing ability to manage symptoms.  Diagnosis: MDD (major depressive disorder), recurrent, severe, with psychosis (HCC) [F33.3]    1. MDD (major depressive disorder), recurrent, severe, with psychosis (HCC)       Donia GuilesJenny Karrin Eisenmenger, LCSW 04/09/2017

## 2017-04-10 ENCOUNTER — Telehealth (HOSPITAL_COMMUNITY): Payer: Self-pay | Admitting: Professional

## 2017-04-10 ENCOUNTER — Other Ambulatory Visit: Payer: Self-pay | Admitting: Pediatrics

## 2017-04-10 ENCOUNTER — Other Ambulatory Visit (HOSPITAL_COMMUNITY): Payer: 59 | Admitting: Licensed Clinical Social Worker

## 2017-04-10 DIAGNOSIS — F333 Major depressive disorder, recurrent, severe with psychotic symptoms: Secondary | ICD-10-CM

## 2017-04-10 MED ORDER — METRONIDAZOLE 500 MG PO TABS: 500.0000 mg | ORAL_TABLET | Freq: Two times a day (BID) | ORAL | 0 refills | Status: AC

## 2017-04-10 MED ORDER — VENLAFAXINE HCL ER 37.5 MG PO CP24: 37.5000 mg | ORAL_CAPSULE | Freq: Every day | ORAL | 2 refills | Status: DC

## 2017-04-10 MED ORDER — ARIPIPRAZOLE 5 MG PO TABS: 5.0000 mg | ORAL_TABLET | Freq: Every day | ORAL | 2 refills | Status: DC

## 2017-04-10 NOTE — Progress Notes (Signed)
Behavioral Health Partial Program Assessment Note  Date: 04/11/2017 Name: Virginia Bennett Sanborn MRN: [id number]   Evaluation: Virginia Bennett Ellena was evaluated by NP, LCSW and Mother for family session. Patient reports she is feeling better overall, however states her mood continues fluctuation depending on her mood and situation. States auditory hallucination which are intermittent. Reports she only hears voices if she is extremely mad or aggravated.  [name] mother reports she has noticed some improvement in her behavior. States she feel as if partial programing has been helpful.  Family has concerns with patient current medication regimen. States she feels as if she is ganing to much weight while taken the Seroquel.  Discussed medication side effects to atypical antipsychotics.  Patient was agreeable to medications adjustment and is currently aware of side effects to include Abilify 5 mg. NP with intitiate Abilify and Effexor.  Discussed patient to monitor diet an increased physical activity. Support, encouragement and reassurance was provided.    Past Surgical History:  Procedure Laterality Date  . FOOT SURGERY Bilateral 2017   Reports had a bond taken out of both feet to correct fallen arches    Past Medical History:  Diagnosis Date  . ADHD (attention deficit hyperactivity disorder)   . GERD (gastroesophageal reflux disease)   . Learning disability    history of IEP beginning in 5th grade  . MDD (major depressive disorder), recurrent episode (HCC) 03/27/2017  . Vitamin D deficiency disease    Outpatient Encounter Medications as of 04/10/2017  Medication Sig Note  . ARIPiprazole (ABILIFY) 5 MG tablet Take 1 tablet (5 mg total) by mouth daily.   Marland Kitchen. doxycycline (VIBRAMYCIN) 100 MG capsule Take 1 capsule (100 mg total) by mouth 2 (two) times daily.   . norethindrone-ethinyl estradiol-iron (JUNEL FE 1.5/30) 1.5-30 MG-MCG tablet Take 1 tablet by mouth daily. (Patient not taking: Reported on  04/09/2017) 04/09/2017: Has not started yet  . pantoprazole (PROTONIX) 40 MG tablet Take 1 tablet (40 mg total) by mouth every evening. For acid reflux   . PRENATAL 27-1 MG TABS Take 1 tablet by mouth daily.   Marland Kitchen. venlafaxine XR (EFFEXOR-XR) 37.5 MG 24 hr capsule Take 1 capsule (37.5 mg total) by mouth daily.   . [DISCONTINUED] clonazePAM (KLONOPIN) 0.5 MG tablet Take 1 tablet (0.5 mg total) by mouth 2 (two) times daily.   . [DISCONTINUED] QUEtiapine (SEROQUEL) 100 MG tablet Take 1 tablet (100 mg total) by mouth at bedtime. 04/01/2017: Takes 2 at night.    No facility-administered encounter medications on file as of 04/10/2017.    Allergies  Allergen Reactions  . Aspirin     Father & Siblings are allergic. So Mom doesn't even give to patient.    Social History   Tobacco Use  . Smoking status: Never Smoker  . Smokeless tobacco: Never Used  Substance Use Topics  . Alcohol use: No    Alcohol/week: 0.0 oz    Family History  Problem Relation Age of Onset  . Diabetes Father   . Asthma Sister   . Asthma Sister   . Anxiety disorder Cousin   . Depression Cousin   . Schizophrenia Cousin      Review of Systems Constitutional: negative  Objective:  There were no vitals filed for this visit.    Mental Status Exam: Appearance:  Well groomed Psychomotor::  Within Normal Limits Attention span and concentration: Normal Behavior: calm, cooperative and adequate rapport can be established Speech:  normal volume Mood:  anxious Affect:  normal Thought Process:  Coherent Thought Content:  Hallucinations: was denied during this assessment  Orientation:  person, place, time/date and month of year Cognition:  grossly intact Insight:  lacking Judgment:  Intact Estimate of Intelligence: Average Fund of knowledge: Intact Memory: Recent and remote intact Abnormal movements: None Gait and station: Normal  Assessment:  Diagnosis: MDD (major depressive disorder), recurrent, severe, with  psychosis (HCC) [F33.3] 1. MDD (major depressive disorder), recurrent, severe, with psychosis (HCC)       Plan: Continue PHP  MDD:    Start Abilify 5 mg and Effexor 75 mg for mood stabilization   Discontinued Seroquel 100 mg QHS  Discontinued Clonazepam 0.5 mg for anxiety  Discontinued  Lexapro 10 mg prescribed while inpatient hospitalization  Patient to follow-up with Psychiatry in 2 weeks  Treatment plan was agreed upon by NP T.Lewis and patient [employer] need for continued group services.    Oneta Rack, NP

## 2017-04-11 ENCOUNTER — Encounter (HOSPITAL_COMMUNITY): Payer: Self-pay | Admitting: Family

## 2017-04-11 LAB — DRUG SCREEN 11 W/CONF, WB
Amphetamines, IA: NEGATIVE ng/mL
Barbiturates, IA: NEGATIVE ug/mL
Benzodiazepines, IA: NEGATIVE ng/mL
Cocaine/Metabolite, IA: NEGATIVE ng/mL
ETHYL ALCOHOL, ENZ: NEGATIVE g/dL
METHADONE, IA: NEGATIVE ng/mL
OPIATES, IA: NEGATIVE ng/mL
OXYCODONES, IA: NEGATIVE ng/mL
Phencyclidine, IA: NEGATIVE ng/mL
Propoxyphene, IA: NEGATIVE ng/mL
THC (Marijuana) Mtb, IA: POSITIVE ng/mL — AB

## 2017-04-11 LAB — THC,MS,WB/SP RFX
CANNABINOID CONFIRMATION: POSITIVE
Cannabidiol: 3.6 ng/mL
Cannabinol: NEGATIVE ng/mL
Carboxy-THC: 29.1 ng/mL
Hydroxy-THC: 1.5 ng/mL
TETRAHYDROCANNABINOL(THC): 2.3 ng/mL

## 2017-04-13 ENCOUNTER — Encounter (HOSPITAL_COMMUNITY): Payer: Self-pay | Admitting: Family

## 2017-04-13 ENCOUNTER — Other Ambulatory Visit (HOSPITAL_COMMUNITY): Payer: 59 | Admitting: Licensed Clinical Social Worker

## 2017-04-13 DIAGNOSIS — F333 Major depressive disorder, recurrent, severe with psychotic symptoms: Secondary | ICD-10-CM | POA: Diagnosis not present

## 2017-04-13 NOTE — Psych (Signed)
[facility] BH PHP THERAPIST PROGRESS NOTE  [name] [id number]  Session Time: 9:00 - 10:30  Participation Level: Active  Behavioral Response: CasualAlertDepressed  Type of Therapy: Group Therapy, Psychotherapy  Treatment Goals addressed: Coping  Interventions: CBT, DBT, Solution Focused, Supportive and Reframing  Summary: Clinician led check-in regarding current stressors and situation, and review of patient completed daily inventory. Clinician utilized active listening and empathetic response and validated patient emotions. Clinician facilitated processing group on pertinent issues.    Therapist Response: Virginia Bennett is a 22 y.o. female who presents with depression symptoms. Patient arrived within time allowed and reports "I don't know how I am feeling, but I am in an okay mood". Pt rates her mood at "7ish" on a scale of 1-10 with 10 being great. Pt reports that she went to the doctor about group yesterday and it ended up being a three-hour appointment. Pt shared that "I cussed out the receptionist because she made me bad". Pt stated that she hung out with friends after her appointment. Pt reports she is still working identifying how to appropriately control her emotions when she is angry. Patient engaged in discussion      Session Time: 10:30 -11:30   Participation Level: Active   Behavioral Response: CasualAlertDepressed   Type of Therapy: Group Therapy, OT   Treatment Goals addressed: Coping   Interventions: Psychosocial skills training, Supportive,    Summary:  Occupational Therapy group   Therapist Response: Patient engaged in group. See OT note.           Session Time: 11:30 - 12:15   Participation Level: Active   Behavioral Response: CasualAlertDepressed   Type of Therapy: Group Therapy, Psychotherapy; Psychoeducation   Treatment Goals addressed: Coping   Interventions: CBT, Solution focused, Supportive, Reframing   Summary: Cln led  psychotherapy group on cognitive distortions shape our view of the future. Group discussed how to make decision about the future without catastrophizing or fortune telling.    Therapist Response:  Patient engaged in discussion and identified ways to work on making an appropriate and healthy decision by setting small and multiple goals as a time.       Session Time: 12:15 - 1:00  Participation Level: Active  Behavioral Response: CasualAlertDepressed  Type of Therapy: Group Therapy, Activity Therapy  Treatment Goals addressed: Coping  Interventions: Psychologist, occupational, Supportive  Summary:  Reflection Group: Patients encouraged to practice skills and interpersonal techniques or work on mindfulness and relaxation techniques. The importance of self-care and making skills part of a routine to increase usage were stressed   Therapist Response: Patient engaged and participated.       Session Time: 12:45- 2:00  Participation Level: Active  Behavioral Response: CasualAlertDepressed  Type of Therapy: Group Therapy, Psychoeducation; Psychotherapy  Treatment Goals addressed: Coping  Interventions: CBT; Solution focused; Supportive; Reframing  Summary: 12:45 - 1:50: Clinician led group on The Five Love Languages. Group members discussed the importance of each language with clinician and took the Five Love Languages quiz. Cln discussed how looking at love languages can counteract unhealthy thought patterns 1:50 -2:00 Clinician led check-out. Clinician assessed for immediate needs, medication compliance and efficacy, and safety concerns   Therapist Response: Patient engaged in activity and discussion. Patient identified her love language as quality time and is going to work on communicating with her support system when she needs one on one time. At check-out, patient rates her mood at a 9 on a scale of 1-10 with 10 being great.  Pt reports that she is going to spend time with her  sister this afternoon. Pt demonstrates some progress as evidenced by identifying ways that she knows she does not feel loved. Pt denies SI/HI/self-harm at the end of group.      Suicidal/Homicidal: Nowithout intent/plan  Plan: Pt will continue in PHP with medication management while continuing to address managing psychosis, decreasing depression symptoms, and increasing ability to manage symptoms.  Diagnosis: MDD (major depressive disorder), recurrent, severe, with psychosis (HCC) [F33.3]    1. MDD (major depressive disorder), recurrent, severe, with psychosis (HCC)       Virginia GuilesJenny Francene Mcerlean, LCSW 04/13/2017

## 2017-04-14 ENCOUNTER — Other Ambulatory Visit (HOSPITAL_COMMUNITY): Payer: 59 | Admitting: Occupational Therapy

## 2017-04-14 ENCOUNTER — Other Ambulatory Visit (HOSPITAL_COMMUNITY): Payer: 59 | Admitting: Licensed Clinical Social Worker

## 2017-04-14 ENCOUNTER — Encounter (HOSPITAL_COMMUNITY): Payer: Self-pay | Admitting: Occupational Therapy

## 2017-04-14 DIAGNOSIS — F333 Major depressive disorder, recurrent, severe with psychotic symptoms: Secondary | ICD-10-CM | POA: Diagnosis not present

## 2017-04-14 DIAGNOSIS — R4589 Other symptoms and signs involving emotional state: Secondary | ICD-10-CM

## 2017-04-14 NOTE — Therapy (Signed)
[facility] PARTIAL HOSPITAL[facility] PROGRAM [address] [place], [place], [address] Phone: [phone]   Fax:  [phone]  Occupational Therapy Treatment  Patient Details  Name: Virginia Bennett MRN: [id number] Date of Birth: [birth year] Referring Provider: Dr. Lucianne MussKumar   Encounter Date: 04/14/2017  OT End of Session - 04/14/17 1329    Visit Number  6    Number of Visits  8    Date for OT Re-Evaluation  04/24/17    Authorization Type  cigna/cigna behavioral health    OT Start Time  1030    OT Stop Time  1145    OT Time Calculation (min)  75 min    Activity Tolerance  Patient tolerated treatment well    Behavior During Therapy  [facility] for tasks assessed/performed       Past Medical History:  Diagnosis Date  . ADHD (attention deficit hyperactivity disorder)   . GERD (gastroesophageal reflux disease)   . Learning disability    history of IEP beginning in 5th grade  . MDD (major depressive disorder), recurrent episode (HCC) 03/27/2017  . Vitamin D deficiency disease     Past Surgical History:  Procedure Laterality Date  . 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  Subjective Assessment - 04/14/17 1328    Currently in Pain?  No/denies         [facility] Assessment - 04/14/17 1328      Assessment   Medical Diagnosis  MDD      Precautions   Precautions  None          OT Treatment Group: Sleep Hygiene   S:  "I wake up a lot at night."  O:  Patient actively participated in the following skilled occupational therapy treatment session this date: Sleep hygiene - Began session with Sleep Hygiene Taboo game, pt actively participated and engaged in game. Discussed importance of a routine leading up to bedtime, and educated on factors influencing sleep. Patient actively engaged during session. Pt participated in group discussion of factors that  interfere with sleep and lifestyle factors promoting sleep. Discussed importance of exercise for the body's metabolism and promoting healthy sleep, as well as foods and stimulants that can help or hinder sleep.   A:  Patient participated in skilled occupational therapy group for sleep hygiene skills this date.  Patient was actively engaged during session and appears open to strategies introduced.   P:  Continue participation in skilled occupational therapy groups  1-2 times per week in order to gain the necessary skills needed to return to full time community living and learn effective coping strategies to be a productive community resident. Next session: time management                OT Short Term Goals - 04/01/17 1737      OT SHORT TERM GOAL #1   Title  Patient will be educated on strategies to improve psychosocial skills needed to participate fully in all daily, work, and leisure activities.     Time  3    Period  Weeks    Status  On-going      OT SHORT TERM GOAL #2   Title  Patient will be educated and independent with HEP.    Time  3    Period  Weeks    Status  On-going  OT SHORT TERM GOAL #3   Title   Patient will independently apply psychosocial skills and coping mechanisms to her daily activities in order to function independently    Time  3    Period  Weeks    Status  On-going               Plan - 04/14/17 1329    OT Treatment/Interventions  Self-care/ADL training;Psychosocial skills training;Coping strategies training       Patient will benefit from skilled therapeutic intervention in order to improve the following deficits and impairments:  Decreased coping skills, Decreased psychosocial skills  Visit Diagnosis: MDD (major depressive disorder), recurrent, severe, with psychosis (HCC)  Difficulty coping    Problem List Patient Active Problem List   Diagnosis Date Noted  . Adjustment disorder, unspecified 03/19/2017  . MDD (major  depressive disorder), single episode, severe with psychosis (HCC) 03/18/2017  . Surveillance of implantable subdermal contraceptive 04/07/2014  . History of ADHD 04/07/2014  . BMI (body mass index), pediatric, greater than or equal to 95% for age 72/10/2014  . Acne vulgaris 03/01/2014  . Slow transit constipation 03/01/2014   Ezra Sites, OTR/L  [phone] 04/14/2017, 1:29 PM  [facility] PARTIAL HOSPITALIZATION PROGRAM [address] [place], [place], [address] Phone: [phone]   Fax:  [phone]  Name: Virginia Bennett MRN: [id number] Date of Birth: 09/22/1995

## 2017-04-15 ENCOUNTER — Encounter (HOSPITAL_COMMUNITY): Payer: Self-pay

## 2017-04-15 ENCOUNTER — Other Ambulatory Visit (HOSPITAL_COMMUNITY): Payer: 59 | Admitting: Licensed Clinical Social Worker

## 2017-04-15 VITALS — BP 108/78 | HR 84 | Ht 66.0 in | Wt 196.0 lb

## 2017-04-15 DIAGNOSIS — F333 Major depressive disorder, recurrent, severe with psychotic symptoms: Secondary | ICD-10-CM | POA: Diagnosis not present

## 2017-04-15 NOTE — Progress Notes (Signed)
Patient presents with brighter affect, reported more level mood and denied any suicidal or homicidal ideations, no auditory or visual hallucinations and reported liking Abilify medication much better than Seroquel.  Patient reported decreased appetite with change to Abilify and discussed dietary and exercise choices to help with management of weight.  Patient reported she was prepared for ending PHP this week and would be following up with individual therapy, anger management classes and medication management after PHP completion.  Patient reported no problems at home or with plans for transition and scored a 10 on her PHQ9 depression screening, down from a 27 from 03/26/17.  Patient agreed to contact this nurse or our office if any problems during transition to her primary psychiatrist, as she will see Dr. Vanetta ShawlHisada 05/16/17 for initial evaluation and then Dr. Rene KocherEksir on 06/11/17.  Patient reported liking current medication regimen with no other concerns and states plan to start oral contraceptive pills once she completes her current menstruation. Patient stable at this time and [facility] without any other noted concerns or issues.

## 2017-04-15 NOTE — Psych (Signed)
   [facility] PHP THERAPIST PROGRESS NOTE  Virginia Bennett [id number]  Session Time: 9:00 - 10:00  Participation Level: Active  Behavioral Response: CasualAlertDepressed  Type of Therapy: Group Therapy, Psychotherapy  Treatment Goals addressed: Coping  Interventions: CBT, DBT, Solution Focused, Supportive and Reframing  Summary: Clinician led check-in regarding current stressors and situation, and review of patient completed daily inventory. Clinician utilized active listening and empathetic response and validated patient emotions. Clinician facilitated processing group on pertinent issues.    Therapist Response: Raiyah Siddiqi is a 22 y.o. female who presents with depression symptoms. Patient arrived within time allowed and reports "I'm in a good mood lately." Pt rates her mood at a 8 on a scale of 1-10 with 10 being great. Pt reports that she talked to a close friend, took a long nap, and "chilled."  Pt reports she is still working to manage her frustrations. Patient engaged in discussion      Session Time: 10:00 - 11:00   Participation Level: Active  Behavioral Response: CasualAlertDepressed  Type of Therapy: Group Therapy, psychotherapy, psychoeducation  Treatment Goals addressed: Coping  Interventions: CBT, solution focused, reframing, Supportive,   Summary:  Cln lead group on recognizing barriers to decision making. Group discussed the ways in which their thought processes created issues and stumbles for making decisions and how to address it.   Therapist Response: Patient engaged in discussion. Pt shared ways in which she struggles with making decisions and states she can use multiple plans to make herself more comfortable.         Session Time: 11:00 - 12:00  Participation Level: Active  Behavioral Response: CasualAlertDepressed  Type of Therapy: Group Therapy, psychotherapy  Treatment Goals addressed: Coping  Interventions: CBT, solution focused,  Supportive, reframing  Summary:  Cln introduced topic of vulnerability. Group viewed TED talk, "The Power of Vulnerability" and pt's discussed how the topic plays out in their lives.   Therapist Response: Patient engaged in discussion. Pt reports vulnerability is a struggle for her and that she tends to shut down when she is vulnerable because it creates feelings of weakness and she is used to being the "protector."         Session Time: 12:00 - 1:00  Participation Level: Active  Behavioral Response: CasualAlertDepressed  Type of Therapy: Family therapy  Treatment Goals addressed: Coping, Progress  Interventions: CBT, DBT, solution focused, Supportive; Reframing  Summary: 12:00 - 1:00 Pt met with mom, program provider, and therapist Whitney C for family session. Pt's progress, points of concern, medication, and treatment plan discussed. See NP note for further information   Therapist Response: Patient engaged in family session. Mom and pt were vocal in addressing needs and concerns.  Patient denies SI/HI/self-harm thoughts at end of day.       Suicidal/Homicidal: Nowithout intent/plan  Plan: Pt will continue in PHP with medication management while continuing to address managing psychosis, decreasing depression symptoms, and increasing ability to manage symptoms.  Diagnosis: MDD (major depressive disorder), recurrent, severe, with psychosis (HCC) [F33.3]    1. MDD (major depressive disorder), recurrent, severe, with psychosis (HCC)        , LCSW 04/15/2017 

## 2017-04-16 ENCOUNTER — Other Ambulatory Visit (HOSPITAL_COMMUNITY): Payer: 59

## 2017-04-16 ENCOUNTER — Other Ambulatory Visit (HOSPITAL_COMMUNITY): Payer: 59 | Admitting: Licensed Clinical Social Worker

## 2017-04-16 DIAGNOSIS — F333 Major depressive disorder, recurrent, severe with psychotic symptoms: Secondary | ICD-10-CM

## 2017-04-16 LAB — C. TRACHOMATIS/N. GONORRHOEAE RNA: GC/Chlamydia probe amp (CH): NEGATIVE

## 2017-04-16 NOTE — Psych (Signed)
[facility]  Virginia Bennett [id number]  Session Time: 9:00 - 10:45  Participation Level: Active  Behavioral Response: CasualAlertDepressed  Type of Therapy: Group Therapy, Psychotherapy  Treatment Goals addressed: Coping  Interventions: CBT, DBT, Solution Focused, Supportive and Reframing  Summary: Clinician led check-in regarding current stressors and situation, and review of patient completed daily inventory. Clinician utilized active listening and empathetic response and validated patient emotions. Clinician facilitated processing group on pertinent issues.    Therapist Response: Virginia Bennett is a 22 y.o. female who presents with depression symptoms. Patient arrived within time allowed and reports "I don't know how I feel." Pt rates her mood at a 5 on a scale of 1-10 with 10 being great. Pt reports that she attended her friend's funeral on Saturday and it was difficult. Pt shares she stayed inside the rest of the weekend. Pt shares she "cussed out" someone at the store and reports she recognized quickly after that there are better ways to handle that. Pt is improving in noticing her impulsive behaviors.  Pt reports she is still working to manage her impulses. Patient engaged in discussion      Session Time: 10:45 - 12:00   Participation Level: Active   Behavioral Response: CasualAlertDepressed   Type of Therapy: Group Therapy, Psychotherapy; Psychoeducation   Treatment Goals addressed: Coping   Interventions: CBT, Solution focused, Supportive, Reframing   Summary: Cln introduced topic of boundaries. Cln provided psychoeducation on what boundaries are, porous, rigid and healthy boundary characteristics, and the different types of boundaries. Group related the information to themselves to begin discovering potential boundary issues.      Therapist Response: Patient engaged in group. Pt reports understanding of boundaries and shares she has  both rigid and porous boundaries.        Session Time: 12:00 - 12:45  Participation Level: Active  Behavioral Response: CasualAlertDepressed  Type of Therapy: Group Therapy, Activity Therapy  Treatment Goals addressed: Coping  Interventions: Psychologist, occupationalocial Skills Training, Supportive  Summary:  Reflection Group: Patients encouraged to practice skills and interpersonal techniques or work on mindfulness and relaxation techniques. The importance of self-care and making skills part of a routine to increase usage were stressed   Therapist Response: Patient engaged and participated.       Session Time: 12:45- 2:00  Participation Level: Alert  Behavioral Response: CasualAlertDepressed  Type of Therapy: Group Therapy, Psychoeducation; Psychotherapy  Treatment Goals addressed: Coping  Interventions: CBT; Solution focused; Supportive; Reframing  Summary: 12:45 - 1:50: Cln led boundary workshop. Cln discussed how to set and maintain boundaries. Group members discussed boundary issues from their lives and group provided feedback and problem solving based on boundary education.  1:50 -2:00 Clinician led check-out. Clinician assessed for immediate needs, medication compliance and efficacy, and safety concerns   Therapist Response: Patient engaged in activity. Pt shared boundary issues with her mom and accepting other people's no. Pt accepted and provided feedback and reports increased understanding of how to set and maintain boundaries.  At check-out, patient rates her mood at a 7 on a scale of 1-10 with 10 being great. Pt reports plans to color and practice her DMV test. Patient demonstrates some progress as evidenced by increased ability to "catch" irrational thoughts. Patient denies SI/HI/self-harm thoughts at the end of group.       Suicidal/Homicidal: Nowithout intent/plan  Plan: Pt will continue in PHP with medication management while continuing to address managing psychosis,  decreasing depression symptoms, and increasing ability to  manage symptoms.  Diagnosis: MDD (major depressive disorder), recurrent, severe, with psychosis (HCC) [F33.3]    1. MDD (major depressive disorder), recurrent, severe, with psychosis (HCC)       [name], LCSW 04/16/2017

## 2017-04-16 NOTE — Psych (Signed)
[facility] BH PHP THERAPIST PROGRESS NOTE  Virginia Bennett [id number]  Session Time: 9:00 - 10:30  Participation Level: Active  Behavioral Response: CasualAlertDepressed  Type of Therapy: Group Therapy, Psychotherapy  Treatment Goals addressed: Coping  Interventions: CBT, DBT, Solution Focused, Supportive and Reframing  Summary: Clinician led check-in regarding current stressors and situation, and review of patient completed daily inventory. Clinician utilized active listening and empathetic response and validated patient emotions. Clinician facilitated processing group on pertinent issues.    Therapist Response: Virginia Bennett is a 22 y.o. female who presents with depression symptoms.  Patient arrived within time allowed and reports that she feels "pretty good." Patient rates her mood at an 8 on a scale of 1-10 with 10 being great. Pt reports having a good evening. Pt shares she went to 3 job interviews as part of meeting her goal of becoming more independent. Pt reports she did not sleep well and believes it may be her new medication. Cln encouraged pt to discuss with provider. Pt reports she needs to continue working on taking steps to be more independent and using skills to manage negative self-talk. Patient engaged in activity and discussion.      Session Time: 10:30 -11:30   Participation Level: Active   Behavioral Response: CasualAlertDepressed   Type of Therapy: Group Therapy, OT   Treatment Goals addressed: Coping   Interventions: Psychosocial skills training, Supportive,    Summary:  Occupational Therapy group   Therapist Response: Patient engaged in group. See OT note.           Session Time: 11:30 - 12:15   Participation Level: Active   Behavioral Response: CasualAlertDepressed   Type of Therapy: Group Therapy, Psychotherapy; Psychoeducation   Treatment Goals addressed: Coping   Interventions: CBT, Solution focused, Supportive, Reframing   Summary:  Clinician introduced topic of "Values and Goals". Group discussed how goals and values are related. Group discussed why values and having goals are important and why it is helpful to know what values/goals are most important to self. Group discussed how to find out what values are most important to self. Group completed a values worksheet to help narrow down most important values to self. Group discussed how values and goals can change over time with growth.     Therapist Response: Patient engaged in group. Pt identifies being part of a group, being productive in life, and having integrity as her top 3 values she wants to focus on.      Session Time: 12:15 - 1:00  Participation Level: Active  Behavioral Response: CasualAlertDepressed  Type of Therapy: Group Therapy, Activity Therapy  Treatment Goals addressed: Coping  Interventions: Psychologist, occupational, Supportive  Summary:  Reflection Group: Patients encouraged to practice skills and interpersonal techniques or work on mindfulness and relaxation techniques. The importance of self-care and making skills part of a routine to increase usage were stressed   Therapist Response: Patient engaged and participated.       Session Time: 12:45- 2:00  Participation Level: Active  Behavioral Response: CasualAlertDepressed  Type of Therapy: Group Therapy, Psychoeducation; Psychotherapy  Treatment Goals addressed: Coping  Interventions: CBT; Solution focused; Supportive; Reframing  Summary: 12:45 - 1:50: Clinician continued with topic of "Values and Goals". Clinician introduced the group to SMART goals. Group discussed how using SMART goals to identify goals to work on would be helpful. Group discussed how values play into goals set. Patient completed a SMART goals worksheet. 1:50 -2:00 Clinician led check-out. Clinician assessed for immediate needs,  medication compliance and efficacy, and safety concerns   Therapist Response: Patient  engaged in activity. Pt states the first SMART goal she wants to work on is obtaining her license in the next month, which goes along with her "be more independent" value. At check-out, patient rates her mood at an 8 on a scale of 1-10 with 10 being great. Pt reports that she has plans of hanging out with her sister. Patient demonstrates some progress as evidenced by increased motivation and movement to work on goals set for herself. Patient denies SI/HI/self-harm thoughts at the end of group.   Suicidal/Homicidal: Nowithout intent/plan  Plan: Pt will continue in PHP with medication management while continuing to address managing psychosis, decreasing depression symptoms, and increasing ability to manage symptoms.  Diagnosis: MDD (major depressive disorder), recurrent, severe, with psychosis (HCC) [F33.3]    1. MDD (major depressive disorder), recurrent, severe, with psychosis (HCC)     Quinn AxeWhitney J Ithiel Liebler, LPCA 04/16/2017

## 2017-04-17 ENCOUNTER — Encounter (HOSPITAL_COMMUNITY): Payer: Self-pay | Admitting: Family

## 2017-04-17 ENCOUNTER — Other Ambulatory Visit (HOSPITAL_COMMUNITY): Payer: 59 | Admitting: Licensed Clinical Social Worker

## 2017-04-17 DIAGNOSIS — F333 Major depressive disorder, recurrent, severe with psychotic symptoms: Secondary | ICD-10-CM

## 2017-04-17 NOTE — Progress Notes (Signed)
  [facility] Discharge Summary  Virginia GinsMakenzie Bennett [id number]  Admission date: 03/25/2017 Discharge date: 04/17/2017  Reason for admission: Depression with psychosis  Per admission assessment note: Virginia Bennett 22 year old female present after a recent inpatient hospitalization.  Reports he has been dealing with stress, depression and hearing voices  prior to her admission. Patient reports her mood has improved slight since discharging from inpatient. Patient was discharge on Lexapro, Trazodone and Gabapentin for depression, anxiety and insomnia.  Reported on 03/26/2017 Patient was seen tearful and distressed. Patient was evaluated  by Psychiatrist and  treatment team reports patient was experiencing command auditory hallucination. Medications was adjusted to Seroquel 100 mg QHS and Klonopin 0.5mg  for Anxiety.  Virginia Bennett reports " I am very guarded and I do not care to share all my issues today." Reports she did take the Seroquel as prescribed and is currently denying depression or auditory hallucination. Reports " I did take the medication my mother gave it to me and I did even smoke on last night"  Reported daily mariajuana use.  Reports she feels the Seroquel is helping her appetite.Support, encouragement and reassurance was provided.   Chemical Use History: + mariajuana   Family of Origin Issues: Reports mother is supportive with medications and treatment plans.   Progress in Program Toward Treatment Goals: Virginia Bennett attended and participated with group session daily. Patient was engaged and supportive with peers. States she is going to continue to "work on my anger." support, encouragement and reassurance was provided.   During her stay in partial hospitalization program,  Virginia Bennett was schedule a family session and medications was adjusted.  Patient was started on Lexapro 10 mg, Trazodone 50 mg and Gabapentin 100 mg BID [facility].  Makenzine  reported adverse reaction to this medications combination. She continued to reports auditory hallucinations and suicidal ideations. MD Eskir then initiated Seroquel 100 mg QHS and Klonopin 0.5mg  for mood stabilization on 03/29/2017. Virginia Bennett reports she was taken and tolerating the Seroquel and Klonopin well, however she continues to expressed concerns with weight gain, depression and racing thoughts. (advised that Klonopin was prescribed as needed). Education was provided with continuing medication and efficacy of starting ans stopping current medication regimen.  Family session was on 04/09/2017 with NP, Therapist, patient's mother and Virginia Bennett. Discussed discharge disposition and medication adjustment was consider and initiated.  Mackenzie to Discontinue Seroquel 100 mg  and Klonopin 0.5mg . Started Effexor- XR 37.5mg  and Abilify 5 mg for mood stabilization and Trazodone 50 mg PRN QHS for insomnia. Discussed medication side effects to include weight again, patient and mother was agreeable to above medications treatment.  Encouraged diet and exercise and keep  all follow up appointments.    Progress (rationale): On going   F/U with MD Erskir and therapist 05/02/2017  Take all medications as prescribed. Keep all follow-up appointments as scheduled.  Do not consume alcohol or use illegal drugs while on prescription medications. Report any adverse effects from your medications to your primary care provider promptly.  In the event of recurrent symptoms or worsening symptoms, call 911, a crisis hotline, or go to the nearest emergency department for evaluation.    Oneta Rackanika N Carianne Taira, NP 04/17/2017

## 2017-04-20 ENCOUNTER — Other Ambulatory Visit (HOSPITAL_COMMUNITY): Payer: Self-pay

## 2017-04-20 NOTE — Psych (Signed)
   [facility] THERAP[facility]GRESS [facility]  Emeline GinsMakenzie Oberhaus [id number]  Session Time: 9:00 - 10:45  Participation Level: Active  Behavioral Response: CasualAlertDepressed  Type of Therapy: Group Therapy, Psychotherapy  Treatment Goals addressed: Coping  Interventions: CBT, DBT, Solution Focused, Supportive and Reframing  Summary: Clinician led check-in regarding current stressors and situation, and review of patient completed daily inventory. Clinician utilized active listening and empathetic response and validated patient emotions. Clinician facilitated processing group on pertinent issues.    Therapist Response: Emeline GinsMakenzie Sepulveda is a 22 y.o. female who presents with depression symptoms. Patient arrived within time allowed and reports that she "chilling." Pt rates her mood at a 7.5 on a scale of 1-10 with 10 being great. Pt reports that she had a fun evening and was able to hang out with the "bros." Pt shared that she slept good and that she woke up in a good mood. Pt reports she is still working on controlling her anger in a healthy manner. Pt engaged in activity and discussion.      Session Time: 10:45 -12:15  Participation Level: Active  Behavioral Response: CasualAlertDepressed  Type of Therapy: Group Therapy  Treatment Goals addressed: Coping  Interventions: Strengths based, reframing, Supportive,   Summary:  Spiritual Care group  Therapist Response: Patient engaged in group. See chaplain note.         Session Time: 12:15 - 1:00  Participation Level: Active  Behavioral Response: CasualAlertDepressed  Type of Therapy: Group Therapy, Activity Therapy  Treatment Goals addressed: Coping  Interventions: Psychologist, occupationalocial Skills Training, Supportive  Summary:  Reflection Group: Patients encouraged to practice skills and interpersonal techniques or work on mindfulness and relaxation techniques. The importance of self-care and making skills part of a routine to increase  usage were stressed   Therapist Response: Patient engaged and participated appropriately.        Session Time: 1:00- 2:00  Participation Level: Did not attend  Behavioral Response: CasualAlertDepressed  Type of Therapy: Group Therapy, Psychoeducation  Treatment Goals addressed: Coping  Interventions: relaxation training; Supportive; Reframing  Summary: 1:00 - 1:50: Relaxation group: Cln Forde RadonLeanne Yates led group focused on retraining the body's response to stress.   1:50 -2:00 Clinician led check-out. Clinician assessed for immediate needs, medication compliance and efficacy, and safety concerns    Therapist Response: Pt engaged and participated in activity.  At check out, pt rated her mood at 8 on 1-10 scale with 10 being great. Pt reports that she is going to clean and rearrange her room after group. Patient demonstrates some progress as evidenced by making an effort to be around enriching people in her life. Patient denies SI/Hi/self-harm at the end of group.      Suicidal/Homicidal: Nowithout intent/plan  Plan: Pt will continue in PHP with medication management while continuing to address managing psychosis, decreasing depression symptoms, and increasing ability to manage symptoms.  Diagnosis: MDD (major depressive disorder), recurrent, severe, with psychosis (HCC) [F33.3]    1. MDD (major depressive disorder), recurrent, severe, with psychosis (HCC)       Donia GuilesJenny Virgen Belland, LCSW 04/20/2017

## 2017-04-20 NOTE — Progress Notes (Signed)
Order(s) created erroneously. Erroneous order ID: [id number] ° Order moved by: NOEL, LAKEA J ° Order move date/time: 04/20/2017 3:07 PM ° Source Patient: [id number] ° Source Contact: 04/08/2017 ° Destination Patient: [id number] ° Destination Contact: 04/08/2017 °

## 2017-04-20 NOTE — Progress Notes (Signed)
Order(s) created erroneously. Erroneous order ID: [id number]  Order moved by: Berenice BoutonNOEL, LAKEA J  Order move date/time: 04/20/2017 3:08 PM  Source Patient: [id number]  Source Contact: 04/08/2017  Destination Patient: [id number]  Destination Contact: 04/08/2017

## 2017-04-21 ENCOUNTER — Ambulatory Visit (HOSPITAL_COMMUNITY): Payer: Self-pay

## 2017-04-21 ENCOUNTER — Other Ambulatory Visit (HOSPITAL_COMMUNITY): Payer: Self-pay

## 2017-04-21 NOTE — Psych (Signed)
[facility] BH PHP THERAPIST PROGRESS NOTE  Jatavia Keltner [id number]  Session Time: 9:00 - 10:00  Participation Level: Active  Behavioral Response: CasualAlertEuthymic  Type of Therapy: Group Therapy, Psychotherapy  Treatment Goals addressed: Coping  Interventions: CBT, DBT, Solution Focused, Supportive and Reframing  Summary: Clinician led check-in regarding current stressors and situation, and review of patient completed daily inventory. Clinician utilized active listening and empathetic response and validated patient emotions. Clinician facilitated processing group on pertinent issues.    Therapist Response: Michaila Kenney is a 22 y.o. female who presents with depression symptoms. Patient arrived within time allowed and reports that she "has good vibes this morning." Pt rates her mood at a "9ish" on a scale of 1-10 with 10 being great. Pt reports that she had a good evening hanging out with her "bros". Pt was excited to share that she now has a job at [employer]. Pt reported that she is planning on working both jobs at this time. Pt reports that she is still working on utilizing her coping skills she learned in group. Pt engaged in activity and discussion.       Session Time: 10:00-11:00  Participation Level: Active  Behavioral Response: CasualAlertEuthymic  Type of Therapy: Group Therapy, Psychotherapy, Psychoeducation  Treatment Goals addressed: Coping  Interventions: CBT, DBT, Solution Focused, Supportive and Reframing  Summary: Clinician continued with topic of "Distress Tolerance".  Clinician discussed "Accepts" skill and how/when patients can employ this method to help.  Patients identified when these techniques may be helpful in their personal lives.  Therapist Response: Patient engaged in activity and discussion. reported that she likes to watch scary movies to help reverse her negative emotions.      Session Time: 11:00-12:00  Participation  Level: Active  Behavioral Response: CasualAlertEuthymic  Type of Therapy: Group Therapy, Psychotherapy, Psychoeducation  Treatment Goals addressed: Coping  Interventions: CBT, DBT, Solution Focused, Supportive and Reframing  Summary:  Clinician continued with topic of "Distress Tolerance".  Clinician discussed "Self-Soothe" and how to utilize. Pt's identified ways to incorporate each of the five senses in a relevant way to them.     Therapist Response: Pt engaged in activity and discussion. Pt identified taste as the sense that helps her the most.  Pt stated that she likes to eat her favorite comfort food, Goldfish, whenever she is feeling anxious.    Session Time: 12:00-1:00  Participation Level: Active  Behavioral Response: CasualAlertEuthymic  Type of Therapy: Group Therapy, Psychotherapy, Psychoeducation, Activity Therapy  Treatment Goals addressed: Coping  Interventions: CBT, DBT, Solution Focused, Supportive and Reframing  Summary: 12:00-12:50: Clinician continued with coping skills.  Clinician provided patients with a list of coping skills.  Patients created "coping skills flashcards" to use in times of need. 12:50 - 1:00 Clinician led check-out. Clinician assessed for immediate needs, medication compliance and efficacy, and safety concerns.  Therapist Response: Pt engaged in activity and discussion. Pt identified talking with her mom and deep breathing as coping skills she uses. Pt rates her mood at a 9 on a scale of 1-10 with 10 being great at the end of group. Pt reports that she is going to spend time with her sister this weekend and help her celebrate her birthday. Pt demonstrates some progress as evidenced by being able to apply and obtain jobs and continued use of coping skills. Pt denies SI/HI/self-harm at the end of group.     Suicidal/Homicidal: Nowithout intent/plan  Plan: Patient will discharge from PHP due to meeting treatment  goals of managing psychosis,  decreasing depression symptoms, and increasing ability to manage symptoms. Progress was measured by observation, self-report, and scales. Provider has approved discharge and patient reports alignment with discharge plan. Patient will step down to outpatient therapy and psychiatry. Patient is scheduled within this agency for psychiatry; Dr. Vanetta ShawlHisada on 4/27. Patient reports waiting on a call back from therapist; Durene Calaylor Krumroy. Patient was provided with information to contact [facility] to participate in their anger management class.  Patient reports she will follow-up with [facility] this afternoon. Patient denies any SI/HI at time of discharge.   Diagnosis: MDD (major depressive disorder), recurrent, severe, with psychosis (HCC) [F33.3]    1. MDD (major depressive disorder), recurrent, severe, with psychosis (HCC)     Quinn AxeWhitney J Avonlea Sima, LPCA 04/21/2017

## 2017-04-21 NOTE — Psych (Signed)
[facility] BH PHP THERAPIST PROGRESS NOTE  Kelci Petrella [id number]  Session Time: 9:00 - 10:30  Participation Level: Active  Behavioral Response: CasualAlertDepressed  Type of Therapy: Group Therapy, Psychotherapy  Treatment Goals addressed: Coping  Interventions: CBT, DBT, Solution Focused, Supportive and Reframing  Summary: Clinician led check-in regarding current stressors and situation, and review of patient completed daily inventory. Clinician utilized active listening and empathetic response and validated patient emotions. Clinician facilitated processing group on pertinent issues.    Therapist Response: Marely Apgar is a 22 y.o. female who presents with depression symptoms.  Patient arrived within time allowed and reports that she is "in a good mood." Pt rates her mood at a 9 on a scale of 1-10 with 10 being great. Pt reports that she woke up happy and says she is ready for a good day. Pt shared that she had a good evening and was able to clean and rearrange her room. Pt also shared that she downloaded a sleep app that helped her fall asleep without taking her sleep meds. Pt reports that she is still working on remembering the skills she can use whenever feels angry. Pt engaged in activity and discussion.       Session Time: 10:30 - 12:00  Participation Level: Active  Behavioral Response: CasualAlertDepressed  Type of Therapy: Group Therapy, Psychotherapy  Treatment Goals addressed: Coping  Interventions: CBT, Solution focused, Supportive, Reframing  Summary:  Cln introduced distress tolerance skills, reviewing their purpose and how to practice them. Cln introduced STOP and TIP skills. Pt's discussed how they could utilize these skills.      Therapist Response: Patient engaged in discussion. Pt reports understanding of skills discussed and pt identified that she is going to do intense exercises when she starts to feel anxious or angry to help reduce hard emotions.          Session Time: 12:00 - 12:45  Participation Level: Active  Behavioral Response: CasualAlertDepressed  Type of Therapy: Group Therapy, Activity Therapy  Treatment Goals addressed: Coping  Interventions: Psychologist, occupational, Supportive  Summary:  Reflection Group: Patients encouraged to practice skills and interpersonal techniques or work on mindfulness and relaxation techniques. The importance of self-care and making skills part of a routine to increase usage were stressed   Therapist Response: Patient engaged and participated appropriately.       Session Time: 12:45- 2:00  Participation Level: Active  Behavioral Response: CasualAlertAnxious and Depressed  Type of Therapy: Group Therapy, Psychoeducation; Psychotherapy  Treatment Goals addressed: Coping  Interventions: CBT; Solution focused; Supportive; Reframing  Summary: 12:45 - 1:50: Clinician continued topic of distress tolerance skills and introduced ACCEPTS skill. Group did "Top 5 List" activity to plan ahead for "E," different emotion.  1:50 -2:00 Clinician led check-out. Clinician assessed for immediate needs, medication compliance and efficacy, and safety concerns   Therapist Response: Patient engaged activity and discussion. Pt reported that she was going to distract herself with watching comedy movies or video clips when she starts to feel anxious or angry.  At check-out, patient rates her mood at a 9 on a scale of 1-10 with 10 being great. 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 Pt stated she felt good about the interview. Pt demonstrates some progress at evidenced by distancing herself from individuals in her life that she knows is going to bring her mood down. Pt denies SI/HI/self-harm at the end of group.  Suicidal/Homicidal: Nowithout intent/plan  Plan: Pt will continue in PHP with medication management while continuing  to address managing psychosis, decreasing depression symptoms, and increasing ability to manage symptoms.  Diagnosis: MDD (major depressive disorder), recurrent, severe, with psychosis (HCC) [F33.3]    1. MDD (major depressive disorder), recurrent, severe, with psychosis (HCC)     Donia GuilesJenny Truitt Cruey, LCSW 04/21/2017

## 2017-04-22 ENCOUNTER — Other Ambulatory Visit (HOSPITAL_COMMUNITY): Payer: Self-pay

## 2017-04-22 ENCOUNTER — Other Ambulatory Visit (HOSPITAL_COMMUNITY): Payer: Self-pay | Admitting: Psychiatry

## 2017-04-22 ENCOUNTER — Encounter (HOSPITAL_COMMUNITY): Payer: Self-pay | Admitting: Psychiatry

## 2017-04-22 MED ORDER — HYDROXYZINE PAMOATE 25 MG PO CAPS: 25.0000 mg | ORAL_CAPSULE | Freq: Three times a day (TID) | ORAL | 2 refills | Status: DC | PRN

## 2017-04-23 ENCOUNTER — Other Ambulatory Visit (HOSPITAL_COMMUNITY): Payer: Self-pay

## 2017-04-23 ENCOUNTER — Ambulatory Visit (HOSPITAL_COMMUNITY): Payer: Self-pay

## 2017-04-24 ENCOUNTER — Other Ambulatory Visit (HOSPITAL_COMMUNITY): Payer: Self-pay

## 2017-05-01 ENCOUNTER — Encounter (HOSPITAL_COMMUNITY): Payer: Self-pay

## 2017-05-01 ENCOUNTER — Emergency Department (HOSPITAL_COMMUNITY)
Admission: EM | Admit: 2017-05-01 | Discharge: 2017-05-02 | Disposition: A | Discharge status: Home / Self Care | Payer: Managed Care, Other (non HMO) | Attending: Emergency Medicine | Admitting: Emergency Medicine

## 2017-05-01 ENCOUNTER — Other Ambulatory Visit: Payer: Self-pay

## 2017-05-01 DIAGNOSIS — R1084 Generalized abdominal pain: Secondary | ICD-10-CM | POA: Insufficient documentation

## 2017-05-01 DIAGNOSIS — G44209 Tension-type headache, unspecified, not intractable: Secondary | ICD-10-CM | POA: Insufficient documentation

## 2017-05-01 DIAGNOSIS — R51 Headache: Secondary | ICD-10-CM | POA: Diagnosis present

## 2017-05-01 DIAGNOSIS — F909 Attention-deficit hyperactivity disorder, unspecified type: Secondary | ICD-10-CM | POA: Insufficient documentation

## 2017-05-01 DIAGNOSIS — Z79899 Other long term (current) drug therapy: Secondary | ICD-10-CM | POA: Diagnosis not present

## 2017-05-01 LAB — CBC WITH DIFFERENTIAL/PLATELET
BASOS ABS: 0 10*3/uL (ref 0.0–0.1)
BASOS PCT: 0 %
EOS ABS: 0.1 10*3/uL (ref 0.0–0.7)
EOS PCT: 2 %
HCT: 39.5 % (ref 36.0–46.0)
HEMOGLOBIN: 13.2 g/dL (ref 12.0–15.0)
LYMPHS ABS: 1.9 10*3/uL (ref 0.7–4.0)
Lymphocytes Relative: 27 %
MCH: 27.9 pg (ref 26.0–34.0)
MCHC: 33.4 g/dL (ref 30.0–36.0)
MCV: 83.5 fL (ref 78.0–100.0)
Monocytes Absolute: 0.6 10*3/uL (ref 0.1–1.0)
Monocytes Relative: 9 %
NEUTROS PCT: 62 %
Neutro Abs: 4.4 10*3/uL (ref 1.7–7.7)
PLATELETS: 264 10*3/uL (ref 150–400)
RBC: 4.73 MIL/uL (ref 3.87–5.11)
RDW: 13.9 % (ref 11.5–15.5)
WBC: 7.1 10*3/uL (ref 4.0–10.5)

## 2017-05-01 LAB — COMPREHENSIVE METABOLIC PANEL
ALT: 72 U/L — AB (ref 14–54)
AST: 40 U/L (ref 15–41)
Albumin: 3.9 g/dL (ref 3.5–5.0)
Alkaline Phosphatase: 67 U/L (ref 38–126)
Anion gap: 9 (ref 5–15)
BILIRUBIN TOTAL: 0.4 mg/dL (ref 0.3–1.2)
BUN: 14 mg/dL (ref 6–20)
CALCIUM: 9.2 mg/dL (ref 8.9–10.3)
CHLORIDE: 105 mmol/L (ref 101–111)
CO2: 23 mmol/L (ref 22–32)
CREATININE: 0.76 mg/dL (ref 0.44–1.00)
Glucose, Bld: 96 mg/dL (ref 65–99)
Potassium: 3.8 mmol/L (ref 3.5–5.1)
Sodium: 137 mmol/L (ref 135–145)
TOTAL PROTEIN: 7.1 g/dL (ref 6.5–8.1)

## 2017-05-01 LAB — URINALYSIS, ROUTINE W REFLEX MICROSCOPIC
BILIRUBIN URINE: NEGATIVE
Glucose, UA: NEGATIVE mg/dL
Hgb urine dipstick: NEGATIVE
KETONES UR: NEGATIVE mg/dL
Leukocytes, UA: NEGATIVE
NITRITE: NEGATIVE
Protein, ur: NEGATIVE mg/dL
SPECIFIC GRAVITY, URINE: 1.027 (ref 1.005–1.030)
pH: 5 (ref 5.0–8.0)

## 2017-05-01 LAB — I-STAT BETA HCG BLOOD, ED (MC, WL, AP ONLY)

## 2017-05-01 LAB — LIPASE, BLOOD: Lipase: 23 U/L (ref 11–51)

## 2017-05-01 MED ORDER — SODIUM CHLORIDE 0.9 % IV BOLUS
1000.0000 mL | Freq: Once | INTRAVENOUS | Status: AC
  Administered 2017-05-01: 1000 mL via INTRAVENOUS

## 2017-05-01 MED ORDER — IBUPROFEN 400 MG PO TABS
600.0000 mg | ORAL_TABLET | Freq: Once | ORAL | Status: AC
  Administered 2017-05-01: 600 mg via ORAL
  Filled 2017-05-01: qty 1

## 2017-05-01 MED ORDER — ONDANSETRON 4 MG PO TBDP
8.0000 mg | ORAL_TABLET | Freq: Once | ORAL | Status: AC
  Administered 2017-05-01: 8 mg via ORAL
  Filled 2017-05-01: qty 2

## 2017-05-01 NOTE — ED Provider Notes (Signed)
Patient placed in Quick Look pathway, seen and evaluated   Chief Complaint: Abdominal pain, headache  HPI:   22 year old female presents with 5 days of abdominal cramping, nausea, lightheadedness and headaches.  She states that she has had generalized abdominal pain which feels like cramping sometimes.  She states she has had gastritis in the past and it does not feel similar.  She has nausea but has not had vomiting. She also has chills, gets lightheaded, and has a headache which wraps around her entire head feels tight like a band. She has a decreased appetite from the nausea. She has not taken any medicines for her symptoms.  She denies similar headaches, vision changes, numbness, weakness, chest pain, shortness of breath, diarrhea, urinary symptoms, vaginal discharge or bleeding.  Her last menstrual period was in March.  She is sexually active and does not use birth control.  ROS: +abdominal pain, headache, nausea, lightheaded  -fever, chest pain, sob, urinary or vaginal sx  Physical Exam:   Gen: No distress  Neuro: Awake and Alert  Skin: Warm    Focused Exam: Neuro: Sitting in NAD. GCS 15. Speaks in a clear voice. Cranial nerves II through XII grossly intact. 5/5 strength in all extremities. Sensation fully intact.  Bilateral finger-to-nose intact. Ambulatory     Heart: Tachycardic with regular rhythm    Lungs: CTA    Abdomen: Generalized tenderness   Initiation of care has begun. The patient has been counseled on the process, plan, and necessity for staying for the completion/evaluation, and the remainder of the medical screening examination    Bethel BornGekas, Dnya Hickle Marie, PA-C 05/01/17 1850    Benjiman CorePickering, Nathan, MD 05/01/17 2215

## 2017-05-01 NOTE — ED Triage Notes (Signed)
Pt endorses headache with some nausea x 2 days. 1 episode of vomiting last night. No neuro deficits. No photo sensitivity. VSS.

## 2017-05-01 NOTE — ED Provider Notes (Signed)
[facility] EMERGENCY DEPARTMENT Provider Note   CSN: [id number] Arrival date & time: 05/01/17  1803     History   Chief Complaint Chief Complaint  Patient presents with  . Headache    HPI Virginia Bennett is a 22 y.o. female with a hx of ADHD, GERD, MDD, presents to the Emergency Department complaining of gradual, persistent, progressively worsening diffuse abdominal cramping onset 5 days ago. Associated symptoms include nausea, decreased appetite, lightheadedness and generalized headaches.  Pt reports her headache is throbbing and tight in nature, wrapping the entire way around her head.  No treatments PTA.  No aggravating or alleviating factors.  She denies fever, chills, neck pain, neck stiffness, vision changes, chest pain, SOB, vomiting, diarrhea, dysuria, hematuria, vaginal discharge, vaginal bleeding.  Pt is sexually active without birth control.  LMP: March 20.  Pt denies sick contacts or international travel.  Pt reports she just started Abilify, vistaril and Effexor.  Pt was given Zofran and ibuprofen on arrival and reports her headache has resolved completely.  Patient states she smokes marijuana daily.  She denies alcohol usage.   The history is provided by the patient and medical records. No language interpreter was used.    Past Medical History:  Diagnosis Date  . ADHD (attention deficit hyperactivity disorder)   . GERD (gastroesophageal reflux disease)   . Learning disability    history of IEP beginning in 5th grade  . MDD (major depressive disorder), recurrent episode (HCC) 03/27/2017  . Vitamin D deficiency disease     Patient Active Problem List   Diagnosis Date Noted  . Adjustment disorder, unspecified 03/19/2017  . MDD (major depressive disorder), single episode, severe with psychosis (HCC) 03/18/2017  . Surveillance of implantable subdermal contraceptive 04/07/2014  . History of ADHD 04/07/2014  . BMI (body mass index), pediatric, greater  than or equal to 95% for age 69/10/2014  . Acne vulgaris 03/01/2014  . Slow transit constipation 03/01/2014    Past Surgical History:  Procedure Laterality Date  . FOOT SURGERY Bilateral 2017   Reports had a bond taken out of both feet to correct fallen arches     OB History   None      Home Medications    Prior to Admission medications   Medication Sig Start Date End Date Taking? Authorizing Provider  ARIPiprazole (ABILIFY) 5 MG tablet Take 1 tablet (5 mg total) by mouth daily. 04/10/17 04/10/18  Oneta Rack, NP  dicyclomine (BENTYL) 20 MG tablet Take 1 tablet (20 mg total) by mouth 2 (two) times daily. 05/02/17   Machelle Raybon, Dahlia Client, PA-C  doxycycline (VIBRAMYCIN) 100 MG capsule Take 1 capsule (100 mg total) by mouth 2 (two) times daily. 04/08/17   Verneda Skill, FNP  hydrOXYzine (VISTARIL) 25 MG capsule Take 1 capsule (25 mg total) by mouth 3 (three) times daily as needed for anxiety. 04/22/17   Eksir, Bo Mcclintock, MD  norethindrone-ethinyl estradiol-iron (JUNEL FE 1.5/30) 1.5-30 MG-MCG tablet Take 1 tablet by mouth daily. Patient not taking: Reported on 04/09/2017 04/08/17   Alfonso Ramus T, FNP  pantoprazole (PROTONIX) 40 MG tablet Take 1 tablet (40 mg total) by mouth every evening. For acid reflux 03/23/17   Money, Feliz Beam B, FNP  PRENATAL 27-1 MG TABS Take 1 tablet by mouth daily. 04/08/17   Verneda Skill, FNP  ranitidine (ZANTAC) 150 MG tablet Take 1 tablet (150 mg total) by mouth 2 (two) times daily. 05/02/17   Coen Miyasato, Dahlia Client, PA-C  venlafaxine XR (  EFFEXOR-XR) 37.5 MG 24 hr capsule Take 1 capsule (37.5 mg total) by mouth daily. 04/10/17 04/10/18  Oneta Rack, NP    Family History Family History  Problem Relation Age of Onset  . Diabetes Father   . Asthma Sister   . Asthma Sister   . Anxiety disorder Cousin   . Depression Cousin   . Schizophrenia Cousin     Social History Social History   Tobacco Use  . Smoking status: Never Smoker  . Smokeless  tobacco: Never Used  Substance Use Topics  . Alcohol use: No    Alcohol/week: 0.0 oz  . Drug use: Yes    Frequency: 21.0 times per week    Types: Marijuana    Comment: Has not used in 2 days, trying to quit     Allergies   Aspirin   Review of Systems Review of Systems  Constitutional: Positive for appetite change. Negative for diaphoresis, fatigue, fever and unexpected weight change.  HENT: Negative for mouth sores.   Eyes: Negative for visual disturbance.  Respiratory: Negative for cough, chest tightness, shortness of breath and wheezing.   Cardiovascular: Negative for chest pain.  Gastrointestinal: Positive for abdominal pain and nausea. Negative for constipation, diarrhea and vomiting.  Endocrine: Negative for polydipsia, polyphagia and polyuria.  Genitourinary: Negative for dysuria, frequency, hematuria and urgency.  Musculoskeletal: Negative for back pain and neck stiffness.  Skin: Negative for rash.  Allergic/Immunologic: Negative for immunocompromised state.  Neurological: Positive for headaches. Negative for syncope and light-headedness.  Hematological: Does not bruise/bleed easily.  Psychiatric/Behavioral: Negative for sleep disturbance. The patient is not nervous/anxious.      Physical Exam Updated Vital Signs BP 123/78 (BP Location: Right Arm)   Pulse 86   Temp 98.2 F (36.8 C) (Oral)   Resp 18   Ht 5' 4.5" (1.638 m)   Wt 81.6 kg (180 lb)   LMP 04/08/2017 (Approximate)   SpO2 100%   BMI 30.42 kg/m   Physical Exam  Constitutional: She appears well-developed and well-nourished. No distress.  Awake, alert, nontoxic appearance  HENT:  Head: Normocephalic and atraumatic.  Mouth/Throat: Oropharynx is clear and moist. No oropharyngeal exudate.  Eyes: Conjunctivae are normal. No scleral icterus.  Neck: Normal range of motion. Neck supple.  Cardiovascular: Normal rate, regular rhythm and intact distal pulses.  Pulmonary/Chest: Effort normal and breath sounds  normal. No respiratory distress. She has no wheezes.  Equal chest expansion  Abdominal: Soft. Bowel sounds are normal. She exhibits no mass. There is generalized tenderness. There is no rigidity, no rebound, no guarding, no CVA tenderness and negative Murphy's sign.  Musculoskeletal: Normal range of motion. She exhibits no edema.  Neurological: She is alert.  Speech is clear and goal oriented Moves extremities without ataxia Ambulates with steady gait 5/5 in the bilateral upper and lower extremities Sensation intact to normal touch  Skin: Skin is warm and dry. She is not diaphoretic.  Psychiatric: She has a normal mood and affect.  Nursing note and vitals reviewed.    ED Treatments / Results  Labs (all labs ordered are listed, but only abnormal results are displayed) Labs Reviewed  COMPREHENSIVE METABOLIC PANEL - Abnormal; Notable for the following components:      Result Value   ALT 72 (*)    All other components within normal limits  URINALYSIS, ROUTINE W REFLEX MICROSCOPIC - Abnormal; Notable for the following components:   APPearance HAZY (*)    All other components within normal limits  CBC  WITH DIFFERENTIAL/PLATELET  LIPASE, BLOOD  I-STAT BETA HCG BLOOD, ED ([facility], [facility], [facility] ONLY)    Procedures Procedures (including critical care time)  Medications Ordered in ED Medications  ibuprofen (ADVIL,MOTRIN) tablet 600 mg (600 mg Oral Given 05/01/17 1850)  ondansetron (ZOFRAN-ODT) disintegrating tablet 8 mg (8 mg Oral Given 05/01/17 1850)  sodium chloride 0.9 % bolus 1,000 mL (0 mLs Intravenous Stopped 05/02/17 0030)  pantoprazole (PROTONIX) EC tablet 40 mg (40 mg Oral Given 05/02/17 0108)     Initial Impression / Assessment and Plan / ED Course  I have reviewed the triage vital signs and the nursing notes.  Pertinent labs & imaging results that were available during my care of the patient were reviewed by me and considered in my medical decision making (see chart for details).       Patient presents with generalized abdominal pain for the last approximately 5 days.  Abdomen is soft and generally tender without guarding or rebound.  Labs are reassuring.  No leukocytosis, normal lipase and AST.  ALT is minimally elevated.  No evidence of urinary tract infection.  Patient is not pregnant.  Suspect her symptoms are secondary to her marijuana usage.  Alternatively, patient may also have a gastritis or viral illness.  She is without vomiting or diarrhea.  Highly doubt colitis, cholecystitis, appendicitis.  She is afebrile without tachycardia.  She does not meet sepsis criteria.  Orthostatic VS for the past 24 hrs:  BP- Lying Pulse- Lying BP- Sitting Pulse- Sitting BP- Standing at 0 minutes Pulse- Standing at 0 minutes  05/01/17 2321 125/73 75 (!) 130/115 79 124/85 85   Patient is without orthostatic vital signs.  No evidence of severe dehydration.  Patient feeling significantly better after Zofran and ibuprofen.    1:12 AM Pt reports feeling much better.  Abd pain has resolved.  She wishes for discharge home. Fluid bolus given.  She will be discharged home with Zantac and Bentyl.  Long discussion with patient about marijuana usage and potential etiology of her symptoms.  She states understanding and is in agreement with the plan for discharge home.  Also discussed reasons to return immediately to the emergency department.    Final Clinical Impressions(s) / ED Diagnoses   Final diagnoses:  Tension headache  Generalized abdominal pain    ED Discharge Orders        Ordered    ranitidine (ZANTAC) 150 MG tablet  2 times daily     05/02/17 0059    dicyclomine (BENTYL) 20 MG tablet  2 times daily     05/02/17 0059       Sarinah Doetsch, Dahlia ClientHannah, PA-C 05/02/17 0113    Shaune PollackIsaacs, Cameron, MD 05/02/17 1109

## 2017-05-02 MED ORDER — PANTOPRAZOLE SODIUM 40 MG PO TBEC
40.0000 mg | DELAYED_RELEASE_TABLET | Freq: Once | ORAL | Status: AC
  Administered 2017-05-02: 40 mg via ORAL
  Filled 2017-05-02: qty 1

## 2017-05-02 MED ORDER — DICYCLOMINE HCL 20 MG PO TABS: 20.0000 mg | ORAL_TABLET | Freq: Two times a day (BID) | ORAL | 0 refills | Status: DC

## 2017-05-02 MED ORDER — RANITIDINE HCL 150 MG PO TABS: 150.0000 mg | ORAL_TABLET | Freq: Two times a day (BID) | ORAL | 0 refills | Status: DC

## 2017-05-02 NOTE — Discharge Instructions (Addendum)
1. Medications: zantac, bentyl (as needed for abd cramping), usual home medications 2. Treatment: rest, drink plenty of fluids, advance diet slowly 3. Follow Up: Please followup with your primary doctor in 2 days for discussion of your diagnoses and further evaluation after today's visit; if you do not have a primary care doctor use the resource guide provided to find one; Please return to the ER for persistent vomiting, high fevers or worsening symptoms

## 2017-05-02 NOTE — ED Notes (Signed)
Unable to obtain signature. Pt. Consented. Discharge pt. Discussed with no questions.
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
## 2017-05-16 ENCOUNTER — Ambulatory Visit (INDEPENDENT_AMBULATORY_CARE_PROVIDER_SITE_OTHER): Payer: 59 | Admitting: Psychiatry

## 2017-05-16 ENCOUNTER — Encounter (HOSPITAL_COMMUNITY): Payer: Self-pay | Admitting: Psychiatry

## 2017-05-16 VITALS — BP 100/63 | HR 78 | Ht 64.5 in | Wt 197.8 lb

## 2017-05-16 DIAGNOSIS — Z818 Family history of other mental and behavioral disorders: Secondary | ICD-10-CM

## 2017-05-16 DIAGNOSIS — R45851 Suicidal ideations: Secondary | ICD-10-CM | POA: Diagnosis not present

## 2017-05-16 DIAGNOSIS — F329 Major depressive disorder, single episode, unspecified: Secondary | ICD-10-CM | POA: Insufficient documentation

## 2017-05-16 DIAGNOSIS — Z915 Personal history of self-harm: Secondary | ICD-10-CM

## 2017-05-16 DIAGNOSIS — R450 Nervousness: Secondary | ICD-10-CM

## 2017-05-16 DIAGNOSIS — F419 Anxiety disorder, unspecified: Secondary | ICD-10-CM | POA: Diagnosis not present

## 2017-05-16 DIAGNOSIS — F1290 Cannabis use, unspecified, uncomplicated: Secondary | ICD-10-CM

## 2017-05-16 DIAGNOSIS — F323 Major depressive disorder, single episode, severe with psychotic features: Secondary | ICD-10-CM | POA: Diagnosis not present

## 2017-05-16 DIAGNOSIS — F410 Panic disorder [episodic paroxysmal anxiety] without agoraphobia: Secondary | ICD-10-CM | POA: Diagnosis not present

## 2017-05-16 MED ORDER — ARIPIPRAZOLE 5 MG PO TABS
5.0000 mg | ORAL_TABLET | Freq: Every day | ORAL | 0 refills | Status: DC
Start: 2017-05-16 — End: 2017-06-11

## 2017-05-16 MED ORDER — VENLAFAXINE HCL ER 150 MG PO CP24: ORAL_CAPSULE | ORAL | 0 refills | Status: DC

## 2017-05-16 NOTE — Patient Instructions (Signed)
1. Increase Effexor 75 mg daily for one week, then 150 mg daily  2. Continue Abilify 5 mg daily  3. Continue trazodone 50 mg at night as needed for sleep 4. She has an appointment with Dr. Rene Kocher in May 5. Sees a therapist. Meredith Leeds weekly  6. CONTACT INFORMATION  What to do if you need to get in touch with someone regarding a psychiatric issue:  1. EMERGENCY: For psychiatric emergencies (if you are suicidal or if there are any other safety issues) call 911 and/or go to your nearest Emergency Room immediately.   2. IF YOU NEED SOMEONE TO TALK TO RIGHT NOW: Given my clinical responsibilities, I may not be able to speak with you over the phone for a prolonged period of time.  a. You may always call The National Suicide Prevention Lifeline at 1-800-273-TALK [phone]).  b. Your county of residence will also have local crisis services. For [facility]: [facility] at [phone]

## 2017-06-08 ENCOUNTER — Ambulatory Visit: Payer: Self-pay | Admitting: Pediatrics

## 2017-06-11 ENCOUNTER — Encounter (HOSPITAL_COMMUNITY): Payer: Self-pay | Admitting: Psychiatry

## 2017-06-11 ENCOUNTER — Ambulatory Visit (INDEPENDENT_AMBULATORY_CARE_PROVIDER_SITE_OTHER): Payer: 59 | Admitting: Psychiatry

## 2017-06-11 DIAGNOSIS — Z818 Family history of other mental and behavioral disorders: Secondary | ICD-10-CM | POA: Diagnosis not present

## 2017-06-11 DIAGNOSIS — F1290 Cannabis use, unspecified, uncomplicated: Secondary | ICD-10-CM

## 2017-06-11 DIAGNOSIS — Z915 Personal history of self-harm: Secondary | ICD-10-CM | POA: Diagnosis not present

## 2017-06-11 DIAGNOSIS — F323 Major depressive disorder, single episode, severe with psychotic features: Secondary | ICD-10-CM

## 2017-06-11 DIAGNOSIS — R45851 Suicidal ideations: Secondary | ICD-10-CM | POA: Diagnosis not present

## 2017-06-11 DIAGNOSIS — Z62898 Other specified problems related to upbringing: Secondary | ICD-10-CM

## 2017-06-11 DIAGNOSIS — F419 Anxiety disorder, unspecified: Secondary | ICD-10-CM | POA: Diagnosis not present

## 2017-06-11 DIAGNOSIS — F333 Major depressive disorder, recurrent, severe with psychotic symptoms: Secondary | ICD-10-CM

## 2017-06-11 MED ORDER — ARIPIPRAZOLE 10 MG PO TABS
10.0000 mg | ORAL_TABLET | Freq: Every day | ORAL | 2 refills | Status: DC
Start: 2017-06-11 — End: 2018-09-23

## 2017-06-11 MED ORDER — ARIPIPRAZOLE 5 MG PO TABS: 10.0000 mg | ORAL_TABLET | Freq: Every day | ORAL | 2 refills | Status: DC

## 2017-06-11 MED ORDER — MIRTAZAPINE 15 MG PO TABS: 15.0000 mg | ORAL_TABLET | Freq: Every day | ORAL | 2 refills | Status: DC

## 2017-06-11 MED ORDER — VENLAFAXINE HCL ER 150 MG PO CP24: 150.0000 mg | ORAL_CAPSULE | Freq: Every day | ORAL | 2 refills | Status: DC

## 2017-06-11 NOTE — Patient Instructions (Signed)
STOP Trazodone  START remeron 15 mg at night  Increase effexor to the 150 mg capsule ( 1 capsule)  Journaling every day!

## 2017-06-11 NOTE — Progress Notes (Signed)
BH MD/PA/NP OP Progress Note  06/11/2017 9:39 AM Virginia Bennett  MRN:  [id number]  Chief Complaint: depression  HPI: Virginia Bennett for psychiatric transfer of care from Dr. Vanetta Shawl after being seen on a Saturday for new patient intake.  We reviewed her trajectory over the past few weeks and months, and she has felt somewhat better but has had a difficult week the past week.  She has also been discontinuing her use of marijuana over the past week and I spent time sharing that I suspect some of her mood and anxiety symptoms this week are due to cannabis withdrawal.  She continues to have trouble sleeping, poor appetite.  She has passive thoughts about death and dying but denies any intention due to promises that she made to her family, specifically mom.  She has not engaged in any self-injurious behaviors.  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  I spent some time with the patient working on building insight into her behaviors and how this interacts with her mood, spent time discussing coping strategies including journaling, encouraged her to consider exercise.  We agreed to increase Effexor, Abilify, and modify her sleep regimen as below.  Visit Diagnosis:    ICD-10-CM   1. 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 MDD (major depressive disorder), recurrent, severe, with psychosis (HCC) F33.3 venlafaxine XR (EFFEXOR-XR) 150 MG 24 hr capsule    mirtazapine (REMERON) 15 MG tablet    ARIPiprazole (ABILIFY) 10 MG tablet    DISCONTINUED: ARIPiprazole (ABILIFY) 5 MG tablet   Past Psychiatric History: See intake H&P for full details. Reviewed,  with no updates at this time.   Past Medical History:  Past Medical History:  Diagnosis Date  . ADHD (attention deficit hyperactivity disorder)   . GERD (gastroesophageal reflux disease)   . Learning disability    history of IEP beginning in 5th grade  . MDD (major depressive disorder), recurrent episode (HCC) 03/27/2017  . Vitamin D deficiency disease     Past Surgical History:  Procedure Laterality Date  . FOOT SURGERY Bilateral 2017   Reports had a bond taken out of both feet to correct fallen arches    Family Psychiatric History: See intake H&P for full details. Reviewed, with no updates at this time.   Family History:  Family History  Problem Relation Age of Onset  . Diabetes Father   . Asthma Sister   . Asthma Sister   . Schizophrenia Brother   . Anxiety disorder Cousin   . Depression Cousin   . Schizophrenia Cousin     Social History:  Social History   Socioeconomic History  . Marital status: Single    Spouse name: Not on file  . Number of children: Not on file  . Years of education: Not on file  . Highest education level: Not on file  Occupational History  . Not on file  Social Needs  . Financial resource strain: Not hard at all  . Food insecurity:    Worry: Never true    Inability: Never true  . Transportation needs:    Medical: No    Non-medical: No  Tobacco Use  . Smoking status: Never Smoker  . Smokeless tobacco: Never Used  Substance and Sexual Activity  . Alcohol use: No    Alcohol/week: 0.0 oz  . Drug use: Yes    Frequency: 21.0 times per week    Types: Marijuana    Comment: Has not used in 2 days, trying to quit  . Sexual activity: Yes    Partners: Male    Birth control/protection: None  Lifestyle  . Physical activity:    Days per week: 0 days    Minutes per session: Not on file  . Stress: Very much  Relationships  . Social connections:    Talks on phone: Twice a week    Gets together: Never    Attends religious service: Never     Active member of club or organization: No    Attends meetings of clubs or organizations: Never    Relationship status: Never married  Other Topics Concern  . Not on file  Social History Narrative   Lives at home mom, mom's spouse 3 siblings. No pets. No smokers. Mom works days as Public house manager. Stepfather is at home. May visit father in [place] in the summer.    Allergies:  Allergies  Allergen Reactions  . Aspirin     Father & Siblings are allergic. So Mom doesn't even give to patient.    Metabolic Disorder Labs: Lab Results  Component Value Date   HGBA1C 4.9 06/20/2016   MPG 94 06/20/2016   MPG 105 03/01/2014   No results found for: PROLACTIN Lab Results  Component Value Date   CHOL 122 03/01/2014   TRIG 51 03/01/2014   HDL 37 03/01/2014   CHOLHDL 3.3 03/01/2014   VLDL 10 03/01/2014   LDLCALC 75 03/01/2014   Lab Results  Component Value Date   TSH 0.86 06/20/2016    Therapeutic Level Labs: No results found for: LITHIUM No results found for: VALPROATE No components found for:  CBMZ  Current Medications: Current Outpatient Medications  Medication Sig Dispense Refill  . ARIPiprazole (ABILIFY) 10 MG tablet Take 1 tablet (10 mg total) by mouth daily. 30 tablet 2  . dicyclomine (BENTYL) 20 MG tablet Take 1 tablet (20 mg total) by mouth 2 (two) times daily. 20 tablet 0  . doxycycline (VIBRAMYCIN) 100 MG capsule Take 1 capsule (100 mg total) by mouth 2 (two) times daily. 28 capsule 0  . mirtazapine (REMERON) 15 MG tablet Take 1 tablet (15 mg total) by mouth at bedtime. 30 tablet 2  . norethindrone-ethinyl estradiol-iron (JUNEL FE 1.5/30) 1.5-30 MG-MCG tablet Take 1 tablet by mouth daily. 1 Package 11  . pantoprazole (PROTONIX) 40 MG tablet Take 1 tablet (40 mg total) by mouth every evening. For acid reflux 30 tablet 0  . PRENATAL 27-1 MG TABS Take 1 tablet by mouth daily. 30 each 11  . ranitidine (ZANTAC) 150 MG tablet Take 1 tablet (150 mg total) by mouth 2 (two) times  daily. 60 tablet 0  . venlafaxine XR (EFFEXOR-XR) 150 MG 24 hr capsule Take 1 capsule (150 mg total) by mouth daily with breakfast. 30 capsule 2   No current facility-administered medications for this visit.      Musculoskeletal: Strength & Muscle Tone: within normal limits Gait & Station: normal Patient leans: N/A  Psychiatric Specialty Exam: ROS  There were no vitals taken for this visit.There is no height or weight on file to calculate BMI.  General Appearance: Casual and Fairly Groomed  Eye Contact:  Fair  Speech:  Clear and Coherent and Normal Rate  Volume:  Normal  Mood:  Depressed and Dysphoric  Affect:  Appropriate and Congruent  Thought Process:  Coherent, Goal Directed and Descriptions of Associations: Intact  Orientation:  Full (Time, Place, and Person)  Thought Content: Logical   Suicidal Thoughts:  No  Homicidal Thoughts:  No  Memory:  Immediate;   Fair  Judgement:  Fair  Insight:  Fair  Psychomotor Activity:  Normal  Concentration:  Concentration: Good  Recall:  Good  Fund of Knowledge: Good  Language: Good  Akathisia:  Negative  Handed:  Right  AIMS (if indicated): not done  Assets:  Communication Skills Desire for Improvement Financial Resources/Insurance Housing  ADL's:  Intact  Cognition: WNL  Sleep:  Fair   Screenings: AIMS     [facility]  AIMS Total Score  0    AUDIT     [facility]  Alcohol Use Disorder Identification Test Final Score (AUDIT)  0    GAD-7     Counselor from 04/17/2017 [facility] Counselor from 03/26/2017 in [facility]  Total GAD-7 Score  3  21    PHQ2-9     Counselor from 04/17/2017 in [facility] Counselor from 04/15/2017 in [facility] Counselor from 03/26/2017 in [facility] Office Visit from 03/01/2014 in [facility]  PHQ-2 Total Score  PHQ-9 Total Score  Assessment and Plan:  Kareem Aul is a 22 year old female with 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  She has fairly poor insight and is working on building further insight into her behaviors and learning more coping strategies.  She denies any acute suicidality but remains at an elevated risk due to her past history of self-harm and suicide attempt.  We agreed to increase Abilify and Effexor as below and switch her from trazodone to Remeron.  I am hopeful that the combination of Effexor and Remeron will also be more beneficial in terms of an antidepressant regimen.  No acute suicidality at this time, we will follow-up in 4 weeks.  1. 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 MDD (major depressive disorder), recurrent, severe, with psychosis (HCC)     Status of current problems: stable  Labs Ordered: No orders of the defined types were placed in this encounter.   Labs Reviewed: n/a  Collateral Obtained/Records Reviewed: recent notes from dr hisada and hospitalization in 02/2017  Plan:  Increase Effexor to 150 mg Increase Abilify to 10 mg for improved mood stabilization Discontinue trazodone given lack of benefit Initiate Remeron 15 mg nightly Follow-up in 4 weeks, continue individual therapy  I spent 25 minutes with the patient in direct face-to-face clinical care.  Greater than 50% of this time was spent in counseling and coordination of care with the patient.    Burnard Leigh, MD 06/11/2017, 9:39 AM

## 2018-01-20 NOTE — L&D Delivery Note (Signed)
Patient: Virginia Bennett MRN: [id number]  GBS status: Negative, IAP given: None   Patient is a 23 y.o. now G1P1 s/p NSVD at [redacted]w[redacted]d, who was admitted for PROM. SROM prior to delivery with clear fluid. AROM of forebag performed immediately prior to delivery.    Delivery Note At 7:42 PM a viable female was delivered via Vaginal, Spontaneous (Presentation: ROA ).  APGAR: 9,9 ; weight pending.   Placenta status: spontaneous, intact.  Cord: 3 vessel with the following complications: none.   Anesthesia:  Epidural  Episiotomy:  None  Lacerations:  None  Suture Repair: N/A Est. Blood Loss (mL):  103  Head delivered ROA. No nuchal cord present. Shoulder and body delivered in usual fashion. Infant with spontaneous cry, placed on mother's abdomen, dried and bulb suctioned. Cord clamped x 2 after 1-minute delay, and cut by family member. Cord blood drawn. Placenta delivered spontaneously with gentle cord traction. Fundus firm with massage and Pitocin. Perineum inspected and found to have no lacerations.  Mom to postpartum.  Baby to Couplet care / Skin to Skin.  Melina Schools 10/31/2018, 7:57 PM

## 2018-04-29 ENCOUNTER — Encounter: Payer: Self-pay | Admitting: Certified Nurse Midwife

## 2018-04-29 ENCOUNTER — Other Ambulatory Visit: Payer: Self-pay

## 2018-04-29 ENCOUNTER — Ambulatory Visit (INDEPENDENT_AMBULATORY_CARE_PROVIDER_SITE_OTHER): Payer: Medicaid Other | Admitting: Certified Nurse Midwife

## 2018-04-29 ENCOUNTER — Other Ambulatory Visit (HOSPITAL_COMMUNITY)
Admission: RE | Admit: 2018-04-29 | Discharge: 2018-04-29 | Disposition: A | Discharge status: Home / Self Care | Payer: Medicaid Other | Source: Ambulatory Visit | Attending: Certified Nurse Midwife | Admitting: Certified Nurse Midwife

## 2018-04-29 VITALS — BP 101/67 | HR 96 | Temp 98.7°F | Wt 196.0 lb

## 2018-04-29 DIAGNOSIS — Z3400 Encounter for supervision of normal first pregnancy, unspecified trimester: Secondary | ICD-10-CM | POA: Diagnosis present

## 2018-04-29 DIAGNOSIS — O26891 Other specified pregnancy related conditions, first trimester: Secondary | ICD-10-CM

## 2018-04-29 DIAGNOSIS — Z3481 Encounter for supervision of other normal pregnancy, first trimester: Secondary | ICD-10-CM | POA: Diagnosis not present

## 2018-04-29 DIAGNOSIS — Z8659 Personal history of other mental and behavioral disorders: Secondary | ICD-10-CM

## 2018-04-29 DIAGNOSIS — N898 Other specified noninflammatory disorders of vagina: Secondary | ICD-10-CM

## 2018-04-29 DIAGNOSIS — Z3A11 11 weeks gestation of pregnancy: Secondary | ICD-10-CM

## 2018-04-29 MED ORDER — VITAFOL GUMMIES 3.33-0.333-34.8 MG PO CHEW: 3.0000 | CHEWABLE_TABLET | Freq: Every day | ORAL | 3 refills | Status: AC

## 2018-04-29 MED ORDER — METRONIDAZOLE 500 MG PO TABS: 500.0000 mg | ORAL_TABLET | Freq: Two times a day (BID) | ORAL | 0 refills | Status: DC

## 2018-04-29 NOTE — Progress Notes (Signed)
History:   Virginia Bennett is a 23 y.o. G1P0 at [redacted]w[redacted]d by LMP being seen today for her first obstetrical visit.  Her obstetrical history is significant for hx of depression. Patient does intend to breast feed. Pregnancy history fully reviewed.  Patient reports nausea, vomiting and vaginal discharge.      HISTORY: OB History  Gravida Para Term Preterm AB Living  1 0 0 0 0 0  SAB TAB Ectopic Multiple Live Births  0 0 0 0 0    # Outcome Date GA Lbr Len/2nd Weight Sex Delivery Anes PTL Lv  1 Current             Patient <21yo, no pap smear needed  Past Medical History:  Diagnosis Date  . ADHD (attention deficit hyperactivity disorder)   . GERD (gastroesophageal reflux disease)   . Learning disability    history of IEP beginning in 5th grade  . MDD (major depressive disorder), recurrent episode (HCC) 03/27/2017  . Vitamin D deficiency disease    Past Surgical History:  Procedure Laterality Date  . FOOT SURGERY Bilateral 2017   Reports had a bond taken out of both feet to correct fallen arches   Family History  Problem Relation Age of Onset  . Diabetes Father   . Asthma Sister   . Asthma Sister   . Schizophrenia Brother   . Anxiety disorder Cousin   . Depression Cousin   . Schizophrenia Cousin    Social History   Tobacco Use  . Smoking status: Never Smoker  . Smokeless tobacco: Never Used  Substance Use Topics  . Alcohol use: No    Alcohol/week: 0.0 standard drinks  . Drug use: Not Currently    Frequency: 21.0 times per week    Types: Marijuana    Comment: Has not used in 2 days, trying to quit   Allergies  Allergen Reactions  . Aspirin     Father & Siblings are allergic. So Mom doesn't even give to patient.   Current Outpatient Medications on File Prior to Visit  Medication Sig Dispense Refill  . ARIPiprazole (ABILIFY) 10 MG tablet Take 1 tablet (10 mg total) by mouth daily. 30 tablet 2  . dicyclomine (BENTYL) 20 MG tablet Take 1 tablet (20 mg total) by  mouth 2 (two) times daily. (Patient not taking: Reported on 04/29/2018) 20 tablet 0  . doxycycline (VIBRAMYCIN) 100 MG capsule Take 1 capsule (100 mg total) by mouth 2 (two) times daily. (Patient not taking: Reported on 04/29/2018) 28 capsule 0  . mirtazapine (REMERON) 15 MG tablet Take 1 tablet (15 mg total) by mouth at bedtime. (Patient not taking: Reported on 04/29/2018) 30 tablet 2  . norethindrone-ethinyl estradiol-iron (JUNEL FE 1.5/30) 1.5-30 MG-MCG tablet Take 1 tablet by mouth daily. (Patient not taking: Reported on 04/29/2018) 1 Package 11  . pantoprazole (PROTONIX) 40 MG tablet Take 1 tablet (40 mg total) by mouth every evening. For acid reflux (Patient not taking: Reported on 04/29/2018) 30 tablet 0  . PRENATAL 27-1 MG TABS Take 1 tablet by mouth daily. (Patient not taking: Reported on 04/29/2018) 30 each 11  . ranitidine (ZANTAC) 150 MG tablet Take 1 tablet (150 mg total) by mouth 2 (two) times daily. (Patient not taking: Reported on 04/29/2018) 60 tablet 0  . venlafaxine XR (EFFEXOR-XR) 150 MG 24 hr capsule Take 1 capsule (150 mg total) by mouth daily with breakfast. (Patient not taking: Reported on 04/29/2018) 30 capsule 2   No current facility-administered medications  on file prior to visit.     Review of Systems Pertinent items noted in HPI and remainder of comprehensive ROS otherwise negative. Physical Exam:   Vitals:   04/29/18 1452  BP: 101/67  Pulse: 96  Temp: 98.7 F (37.1 C)  Weight: 196 lb (88.9 kg)   Fetal Heart Rate (bpm): 131 Pelvic Exam: Perineum: no hemorrhoids, normal perineum   Vulva: normal external genitalia, no lesions   Vagina:  normal mucosa, moderate amount of white thin discharge with odor   System: General: well-developed, well-nourished female in no acute distress   Breasts:  normal appearance, no masses or tenderness bilaterally   Skin: normal coloration and turgor, no rashes   Neurologic: oriented, normal, negative, normal mood   Extremities: normal strength,  tone, and muscle mass, ROM of all joints is normal   HEENT PERRLA, extraocular movement intact and sclera clear   Mouth/Teeth mucous membranes moist, pharynx normal without lesions and dental hygiene good   Neck supple and no masses   Cardiovascular: regular rate and rhythm   Respiratory:  no respiratory distress, normal breath sounds   Abdomen: soft, non-tender; bowel sounds normal; no masses,  no organomegaly     Assessment:    Pregnancy: G1P0 Patient Active Problem List   Diagnosis Date Noted  . Supervision of normal first pregnancy 04/29/2018  . MDD (major depressive disorder), single episode with atypical features 05/16/2017  . Adjustment disorder, unspecified 03/19/2017  . MDD (major depressive disorder), single episode, severe with psychosis (HCC) 03/18/2017  . Surveillance of implantable subdermal contraceptive 04/07/2014  . History of ADHD 04/07/2014  . BMI (body mass index), pediatric, greater than or equal to 95% for age 42/10/2014  . Acne vulgaris 03/01/2014  . Slow transit constipation 03/01/2014     Plan:    1. Supervision of normal first pregnancy, antepartum - Routine prenatal care, enrolled in babyscripts and BP cuff given in office  - Discussed babyscripts and need to enter BP and weight weekly, patient verbalizes understanding  - Anticipatory guidance on upcoming appointments  - COVID19 precautions  - Babyscripts Schedule Optimization - Enroll Patient in Babyscripts - Culture, OB Urine - Genetic Screening - Cervicovaginal ancillary only( [place]) - Obstetric Panel, Including HIV - Prenatal Vit-Fe Phos-FA-Omega (VITAFOL GUMMIES) 3.33-0.333-34.8 MG CHEW; Chew 3 tablets by mouth daily.  Dispense: 90 tablet; Refill: 3  2. Vaginal discharge during pregnancy in first trimester - Reports white thin discharge with mild odor, denies itching or irritation  - Hx of BV, will treat for BV based on clinical symptoms  - metroNIDAZOLE (FLAGYL) 500 MG tablet; Take 1  tablet (500 mg total) by mouth 2 (two) times daily.  Dispense: 14 tablet; Refill: 0   Initial labs drawn. Continue prenatal vitamins. Genetic Screening discussed, NIPS: ordered. Ultrasound discussed; fetal anatomic survey: requested. Problem list reviewed and updated. The nature of  [facility] with multiple MDs and other Advanced Practice Providers was explained to patient; also emphasized that residents, students are part of our team. Routine obstetric precautions reviewed. Return in about 4 weeks (around 05/27/2018) for TELEHEALTH ROB .     Sharyon CableVeronica C Dewanna Hurston, CNM [facility], [facility]

## 2018-04-29 NOTE — Progress Notes (Signed)
New OB.   Declined FLU.

## 2018-04-30 LAB — OBSTETRIC PANEL, INCLUDING HIV
Antibody Screen: NEGATIVE
Basophils Absolute: 0 10*3/uL (ref 0.0–0.2)
Basos: 0 %
EOS (ABSOLUTE): 0.1 10*3/uL (ref 0.0–0.4)
Eos: 1 %
HIV Screen 4th Generation wRfx: NONREACTIVE
Hematocrit: 33.8 % — ABNORMAL LOW (ref 34.0–46.6)
Hemoglobin: 11.4 g/dL (ref 11.1–15.9)
Hepatitis B Surface Ag: NEGATIVE
Immature Grans (Abs): 0.1 10*3/uL (ref 0.0–0.1)
Immature Granulocytes: 1 %
Lymphocytes Absolute: 1.4 10*3/uL (ref 0.7–3.1)
Lymphs: 15 %
MCH: 27.8 pg (ref 26.6–33.0)
MCHC: 33.7 g/dL (ref 31.5–35.7)
MCV: 82 fL (ref 79–97)
Monocytes Absolute: 0.7 10*3/uL (ref 0.1–0.9)
Monocytes: 7 %
Neutrophils Absolute: 7.5 10*3/uL — ABNORMAL HIGH (ref 1.4–7.0)
Neutrophils: 76 %
Platelets: 358 10*3/uL (ref 150–450)
RBC: 4.1 x10E6/uL (ref 3.77–5.28)
RDW: 12.6 % (ref 11.7–15.4)
RPR Ser Ql: NONREACTIVE
Rh Factor: POSITIVE
Rubella Antibodies, IGG: 1.68 index (ref >0.99)
WBC: 9.8 10*3/uL (ref 3.4–10.8)

## 2018-05-01 LAB — URINE CULTURE, OB REFLEX

## 2018-05-01 LAB — CULTURE, OB URINE

## 2018-05-03 LAB — CERVICOVAGINAL ANCILLARY ONLY
Bacterial vaginitis: POSITIVE — AB
Candida vaginitis: NEGATIVE
Chlamydia: NEGATIVE
Neisseria Gonorrhea: NEGATIVE
Trichomonas: NEGATIVE

## 2018-05-05 ENCOUNTER — Encounter: Payer: Self-pay | Admitting: Certified Nurse Midwife

## 2018-05-12 ENCOUNTER — Other Ambulatory Visit: Payer: Self-pay | Admitting: *Deleted

## 2018-05-12 ENCOUNTER — Other Ambulatory Visit: Payer: Self-pay

## 2018-05-12 DIAGNOSIS — N898 Other specified noninflammatory disorders of vagina: Secondary | ICD-10-CM

## 2018-05-12 DIAGNOSIS — N760 Acute vaginitis: Principal | ICD-10-CM

## 2018-05-12 DIAGNOSIS — O26891 Other specified pregnancy related conditions, first trimester: Principal | ICD-10-CM

## 2018-05-12 DIAGNOSIS — B9689 Other specified bacterial agents as the cause of diseases classified elsewhere: Secondary | ICD-10-CM

## 2018-05-12 MED ORDER — METRONIDAZOLE 0.75 % VA GEL: 1.0000 | Freq: Two times a day (BID) | VAGINAL | 0 refills | Status: DC

## 2018-05-12 NOTE — Progress Notes (Signed)
Pt request refill on Flagyl Rx. Called pt, she stopped taking initial Rx due yo vomiting after taking. Advised pt to use Metrogel so oral Rx may still cause her to feel sick.  Pt agrees, Metrogel sent today.

## 2018-05-13 ENCOUNTER — Encounter: Payer: Self-pay | Admitting: Certified Nurse Midwife

## 2018-05-27 ENCOUNTER — Ambulatory Visit (INDEPENDENT_AMBULATORY_CARE_PROVIDER_SITE_OTHER): Payer: Medicaid Other | Admitting: Certified Nurse Midwife

## 2018-05-27 ENCOUNTER — Other Ambulatory Visit: Payer: Self-pay

## 2018-05-27 VITALS — BP 120/87

## 2018-05-27 DIAGNOSIS — Z3402 Encounter for supervision of normal first pregnancy, second trimester: Secondary | ICD-10-CM

## 2018-05-27 DIAGNOSIS — Z3A15 15 weeks gestation of pregnancy: Secondary | ICD-10-CM

## 2018-05-27 NOTE — Progress Notes (Signed)
Pt states she is no longer having having stomach issues and she did complete Rx.

## 2018-05-27 NOTE — Progress Notes (Signed)
TELEHEALTH VIRTUAL OBSTETRICS PRENATAL VISIT ENCOUNTER NOTE  I connected with Virginia Bennett on 05/27/18 at 11:15 AM EDT by WebEx at home and verified that I am speaking with the correct person using two identifiers.   I discussed the limitations, risks, security and privacy concerns of performing an evaluation and management service by telephone and the availability of in person appointments. I also discussed with the patient that there may be a patient responsible charge related to this service. The patient expressed understanding and agreed to proceed. Subjective:  Virginia Bennett is a 23 y.o. G1P0 at [redacted]w[redacted]d being seen today for ongoing prenatal care.  She is currently monitored for the following issues for this low-risk pregnancy and has BMI (body mass index), pediatric, greater than or equal to 95% for age; Acne vulgaris; Slow transit constipation; Surveillance of implantable subdermal contraceptive; History of ADHD; MDD (major depressive disorder), single episode, severe with psychosis (HCC); Adjustment disorder, unspecified; MDD (major depressive disorder), single episode with atypical features; and Supervision of normal first pregnancy on their problem list.  Patient reports no complaints.  Reports fetal movement. Contractions: Not present. Vag. Bleeding: None.  Movement: Absent. Denies any contractions, bleeding or leaking of fluid.   The following portions of the patient's history were reviewed and updated as appropriate: allergies, current medications, past family history, past medical history, past social history, past surgical history and problem list.   Objective:   Vitals:   05/27/18 1123  BP: 120/87    Fetal Status:     Movement: Absent     General:  Alert, oriented and cooperative. Patient is in no acute distress.  Respiratory: Normal respiratory effort, no problems with respiration noted  Mental Status: Normal mood and affect. Normal behavior. Normal judgment and  thought content.  Rest of physical exam deferred due to type of encounter  Assessment and Plan:  Pregnancy: G1P0 at [redacted]w[redacted]d 1. Encounter for supervision of normal first pregnancy in second trimester - BP over the past 3 weeks- reports phone "has been messing up and can not enter in babyscripts". Has been taking weekly BP and taking a picture of them. Listed below:  120/87 106/86 127/72 - Routine prenatal care  - Anticipatory guidance on upcoming appointments including [place] and in person appointment for AFP  - Patient reports the pain on mychart message she believes might have been fetal movement, reports gas pain and flutters. Educated and discussed fetal movement and what to expect. Discussed reasons to be evaluated such as cramping, vaginal bleeding or LOF.  - [place] MFM OB COMP + 14 WK; Future  Preterm labor symptoms and general obstetric precautions including but not limited to vaginal bleeding, contractions, leaking of fluid and fetal movement were reviewed in detail with the patient. I discussed the assessment and treatment plan with the patient. The patient was provided an opportunity to ask questions and all were answered. The patient agreed with the plan and demonstrated an understanding of the instructions. The patient was advised to call back or seek an in-person office evaluation/go to [facility] at [facility] for any urgent or concerning symptoms. Please refer to After Visit Summary for other counseling recommendations.   I provided 10 minutes of face-to-face via WebEx time during this encounter.  Return in about 4 weeks (around 06/24/2018) for ROB- in person (AFP).  Future Appointments  Date Time Provider Department Center  06/24/2018 10:30 AM WH-MFC [place] 1 WH-MFCUS M[facility]-US    Sharyon Cable, CNM [facility],  [facility]  

## 2018-06-10 ENCOUNTER — Other Ambulatory Visit: Payer: Self-pay | Admitting: Certified Nurse Midwife

## 2018-06-10 DIAGNOSIS — O219 Vomiting of pregnancy, unspecified: Secondary | ICD-10-CM

## 2018-06-10 MED ORDER — ONDANSETRON 4 MG PO TBDP
4.0000 mg | ORAL_TABLET | Freq: Three times a day (TID) | ORAL | 2 refills | Status: DC | PRN
Start: 2018-06-10 — End: 2018-09-23

## 2018-06-17 ENCOUNTER — Other Ambulatory Visit: Payer: Self-pay

## 2018-06-17 ENCOUNTER — Emergency Department (HOSPITAL_COMMUNITY): Payer: Medicaid Other

## 2018-06-17 ENCOUNTER — Emergency Department (HOSPITAL_COMMUNITY)
Admission: EM | Admit: 2018-06-17 | Discharge: 2018-06-17 | Disposition: A | Discharge status: Home / Self Care | Payer: Medicaid Other | Attending: Emergency Medicine | Admitting: Emergency Medicine

## 2018-06-17 ENCOUNTER — Encounter (HOSPITAL_COMMUNITY): Payer: Self-pay | Admitting: Emergency Medicine

## 2018-06-17 DIAGNOSIS — Y999 Unspecified external cause status: Secondary | ICD-10-CM | POA: Insufficient documentation

## 2018-06-17 DIAGNOSIS — Z79899 Other long term (current) drug therapy: Secondary | ICD-10-CM | POA: Diagnosis not present

## 2018-06-17 DIAGNOSIS — R1033 Periumbilical pain: Secondary | ICD-10-CM | POA: Insufficient documentation

## 2018-06-17 DIAGNOSIS — O26892 Other specified pregnancy related conditions, second trimester: Secondary | ICD-10-CM | POA: Insufficient documentation

## 2018-06-17 DIAGNOSIS — S0990XA Unspecified injury of head, initial encounter: Secondary | ICD-10-CM | POA: Diagnosis present

## 2018-06-17 DIAGNOSIS — Y929 Unspecified place or not applicable: Secondary | ICD-10-CM | POA: Diagnosis not present

## 2018-06-17 DIAGNOSIS — Y939 Activity, unspecified: Secondary | ICD-10-CM | POA: Diagnosis not present

## 2018-06-17 DIAGNOSIS — Z3A18 18 weeks gestation of pregnancy: Secondary | ICD-10-CM | POA: Insufficient documentation

## 2018-06-17 DIAGNOSIS — O9A212 Injury, poisoning and certain other consequences of external causes complicating pregnancy, second trimester: Secondary | ICD-10-CM | POA: Insufficient documentation

## 2018-06-17 DIAGNOSIS — S0502XA Injury of conjunctiva and corneal abrasion without foreign body, left eye, initial encounter: Secondary | ICD-10-CM | POA: Insufficient documentation

## 2018-06-17 LAB — COMPREHENSIVE METABOLIC PANEL
ALT: 20 U/L (ref 0–44)
AST: 24 U/L (ref 15–41)
Albumin: 3.3 g/dL — ABNORMAL LOW (ref 3.5–5.0)
Alkaline Phosphatase: 44 U/L (ref 38–126)
Anion gap: 7 (ref 5–15)
BUN: <5 mg/dL — ABNORMAL LOW (ref 6–20)
CO2: 20 mmol/L — ABNORMAL LOW (ref 22–32)
Calcium: 8.9 mg/dL (ref 8.9–10.3)
Chloride: 110 mmol/L (ref 98–111)
Creatinine, Ser: 0.73 mg/dL (ref 0.44–1.00)
GFR calc Af Amer: >60 mL/min (ref >60)
GFR calc non Af Amer: >60 mL/min (ref >60)
Glucose, Bld: 87 mg/dL (ref 70–99)
Potassium: 3.5 mmol/L (ref 3.5–5.1)
Sodium: 137 mmol/L (ref 135–145)
Total Bilirubin: 0.5 mg/dL (ref 0.3–1.2)
Total Protein: 6.3 g/dL — ABNORMAL LOW (ref 6.5–8.1)

## 2018-06-17 LAB — CBC
HCT: 35.6 % — ABNORMAL LOW (ref 36.0–46.0)
Hemoglobin: 11.5 g/dL — ABNORMAL LOW (ref 12.0–15.0)
MCH: 28.1 pg (ref 26.0–34.0)
MCHC: 32.3 g/dL (ref 30.0–36.0)
MCV: 87 fL (ref 80.0–100.0)
Platelets: 330 10*3/uL (ref 150–400)
RBC: 4.09 MIL/uL (ref 3.87–5.11)
RDW: 13.8 % (ref 11.5–15.5)
WBC: 12.5 10*3/uL — ABNORMAL HIGH (ref 4.0–10.5)
nRBC: 0 % (ref 0.0–0.2)

## 2018-06-17 MED ORDER — TETRACAINE HCL 0.5 % OP SOLN
2.0000 [drp] | Freq: Once | OPHTHALMIC | Status: AC
  Administered 2018-06-17: 2 [drp] via OPHTHALMIC
  Filled 2018-06-17: qty 4

## 2018-06-17 MED ORDER — FLUORESCEIN SODIUM 1 MG OP STRP
1.0000 | ORAL_STRIP | Freq: Once | OPHTHALMIC | Status: AC
  Administered 2018-06-17: 1 via OPHTHALMIC
  Filled 2018-06-17: qty 1

## 2018-06-17 MED ORDER — ERYTHROMYCIN 5 MG/GM OP OINT: TOPICAL_OINTMENT | Freq: Four times a day (QID) | OPHTHALMIC | 0 refills | Status: AC
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
## 2018-06-17 NOTE — ED Notes (Signed)
Tonopen, slit lamp, visual acuity test, pontocaine, and fluorescein strip at bedside

## 2018-06-17 NOTE — Discharge Instructions (Addendum)
The ophthalmologist recommended that the swelling go down prior to evaluations that he can better view the eye.  Return to the emergency department if you were to have worsening vision, significant increase in pain, or purulent drainage from the left eye.

## 2018-06-17 NOTE — ED Notes (Signed)
Pt notified female visitor came to check on pt and notified he would like update.

## 2018-06-17 NOTE — ED Notes (Signed)
Little, MD at bedside. 

## 2018-06-17 NOTE — ED Notes (Signed)
Patient transported to CT 

## 2018-06-17 NOTE — ED Notes (Signed)
ED Provider at bedside. 
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
## 2018-06-17 NOTE — ED Notes (Addendum)
Significant other here at the desk, would like an update [phone]

## 2018-06-17 NOTE — ED Notes (Signed)
Discharge instructions discussed with pt. Pt verbalized understanding. Pt. To go home with mother.

## 2018-06-21 DIAGNOSIS — S0591XA Unspecified injury of right eye and orbit, initial encounter: Secondary | ICD-10-CM

## 2018-06-21 HISTORY — DX: Unspecified injury of right eye and orbit, initial encounter: S05.91XA

## 2018-06-24 ENCOUNTER — Other Ambulatory Visit: Payer: Self-pay | Admitting: Certified Nurse Midwife

## 2018-06-24 ENCOUNTER — Other Ambulatory Visit: Payer: Medicaid Other

## 2018-06-24 ENCOUNTER — Encounter: Payer: Self-pay | Admitting: Certified Nurse Midwife

## 2018-06-24 ENCOUNTER — Other Ambulatory Visit: Payer: Self-pay

## 2018-06-24 ENCOUNTER — Encounter: Payer: Medicaid Other | Admitting: Certified Nurse Midwife

## 2018-06-24 ENCOUNTER — Ambulatory Visit (HOSPITAL_COMMUNITY)
Admission: RE | Admit: 2018-06-24 | Discharge: 2018-06-24 | Disposition: A | Discharge status: Home / Self Care | Payer: Medicaid Other | Source: Ambulatory Visit | Attending: Obstetrics and Gynecology | Admitting: Obstetrics and Gynecology

## 2018-06-24 ENCOUNTER — Ambulatory Visit (INDEPENDENT_AMBULATORY_CARE_PROVIDER_SITE_OTHER): Payer: Medicaid Other | Admitting: Certified Nurse Midwife

## 2018-06-24 DIAGNOSIS — O359XX0 Maternal care for (suspected) fetal abnormality and damage, unspecified, not applicable or unspecified: Secondary | ICD-10-CM

## 2018-06-24 DIAGNOSIS — Z148 Genetic carrier of other disease: Secondary | ICD-10-CM

## 2018-06-24 DIAGNOSIS — Z363 Encounter for antenatal screening for malformations: Secondary | ICD-10-CM | POA: Diagnosis not present

## 2018-06-24 DIAGNOSIS — Z3402 Encounter for supervision of normal first pregnancy, second trimester: Secondary | ICD-10-CM

## 2018-06-24 DIAGNOSIS — O99212 Obesity complicating pregnancy, second trimester: Secondary | ICD-10-CM

## 2018-06-24 DIAGNOSIS — Z3A19 19 weeks gestation of pregnancy: Secondary | ICD-10-CM

## 2018-06-24 NOTE — Progress Notes (Signed)
Pt presents for televisit. Pt identified with 2 pt identifiers. She did go for her anatomy scan. Pt has no concerns.
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
## 2018-06-27 LAB — AFP, SERUM, OPEN SPINA BIFIDA
AFP MoM: 3.03
AFP Value: 150.1 ng/mL
Gest. Age on Collection Date: 19.6 weeks
Maternal Age At EDD: 21.2 yr
OSBR Risk 1 IN: 224
Test Results:: POSITIVE — AB
Weight: 193 [lb_av]

## 2018-07-07 ENCOUNTER — Emergency Department (HOSPITAL_COMMUNITY): Payer: Medicaid Other

## 2018-07-07 ENCOUNTER — Other Ambulatory Visit: Payer: Self-pay

## 2018-07-07 ENCOUNTER — Emergency Department (HOSPITAL_COMMUNITY)
Admission: EM | Admit: 2018-07-07 | Discharge: 2018-07-08 | Disposition: A | Discharge status: Home / Self Care | Payer: Medicaid Other | Attending: Emergency Medicine | Admitting: Emergency Medicine

## 2018-07-07 ENCOUNTER — Encounter (HOSPITAL_COMMUNITY): Payer: Self-pay

## 2018-07-07 DIAGNOSIS — M79662 Pain in left lower leg: Secondary | ICD-10-CM | POA: Diagnosis not present

## 2018-07-07 DIAGNOSIS — Y92410 Unspecified street and highway as the place of occurrence of the external cause: Secondary | ICD-10-CM | POA: Diagnosis not present

## 2018-07-07 DIAGNOSIS — Z79899 Other long term (current) drug therapy: Secondary | ICD-10-CM | POA: Diagnosis not present

## 2018-07-07 DIAGNOSIS — H538 Other visual disturbances: Secondary | ICD-10-CM | POA: Insufficient documentation

## 2018-07-07 DIAGNOSIS — Y9389 Activity, other specified: Secondary | ICD-10-CM | POA: Insufficient documentation

## 2018-07-07 DIAGNOSIS — R2242 Localized swelling, mass and lump, left lower limb: Secondary | ICD-10-CM | POA: Diagnosis not present

## 2018-07-07 DIAGNOSIS — O9989 Other specified diseases and conditions complicating pregnancy, childbirth and the puerperium: Secondary | ICD-10-CM | POA: Diagnosis not present

## 2018-07-07 DIAGNOSIS — H1131 Conjunctival hemorrhage, right eye: Secondary | ICD-10-CM | POA: Insufficient documentation

## 2018-07-07 DIAGNOSIS — Y999 Unspecified external cause status: Secondary | ICD-10-CM | POA: Diagnosis not present

## 2018-07-07 DIAGNOSIS — Z3A22 22 weeks gestation of pregnancy: Secondary | ICD-10-CM | POA: Diagnosis not present

## 2018-07-07 DIAGNOSIS — S01111A Laceration without foreign body of right eyelid and periocular area, initial encounter: Secondary | ICD-10-CM | POA: Diagnosis not present

## 2018-07-07 MED ORDER — TETRACAINE HCL 0.5 % OP SOLN
2.0000 [drp] | Freq: Once | OPHTHALMIC | Status: AC
  Administered 2018-07-07: 2 [drp] via OPHTHALMIC
  Filled 2018-07-07: qty 4

## 2018-07-07 MED ORDER — FLUORESCEIN SODIUM 1 MG OP STRP
1.0000 | ORAL_STRIP | Freq: Once | OPHTHALMIC | Status: AC
  Administered 2018-07-07: 22:00:00 1 via OPHTHALMIC
  Filled 2018-07-07: qty 1

## 2018-07-07 MED ORDER — ACETAMINOPHEN 500 MG PO TABS
1000.0000 mg | ORAL_TABLET | Freq: Once | ORAL | Status: AC
  Administered 2018-07-07: 20:00:00 1000 mg via ORAL
  Filled 2018-07-07: qty 2

## 2018-07-07 NOTE — ED Notes (Signed)
R 20/80 L 20/32 Needs glasses. Does not wear any.
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
## 2018-07-07 NOTE — ED Notes (Signed)
Spoke with patient's mother and provided update.  

## 2018-07-07 NOTE — ED Notes (Signed)
Pt's mother would like to be called once pt is settled.

## 2018-07-08 ENCOUNTER — Encounter: Payer: Self-pay | Admitting: Ophthalmology

## 2018-07-08 MED ORDER — ERYTHROMYCIN 5 MG/GM OP OINT
1.0000 "application " | TOPICAL_OINTMENT | Freq: Once | OPHTHALMIC | Status: AC
  Administered 2018-07-08: 1 via OPHTHALMIC
  Filled 2018-07-08: qty 3.5
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
## 2018-07-09 ENCOUNTER — Telehealth: Payer: Self-pay

## 2018-07-09 ENCOUNTER — Telehealth: Payer: Self-pay | Admitting: *Deleted

## 2018-07-09 MED ORDER — METRONIDAZOLE 0.75 % VA GEL: 1.0000 | Freq: Every day | VAGINAL | Status: AC

## 2018-07-09 NOTE — Telephone Encounter (Signed)
Patient messaged requesting treatment for what she believes is BV.  She recently had BV in April and was not able to tolerate the PO medication in April and did not complete her treatment.  Metrogel routed to pharmacy per protocol.

## 2018-07-09 NOTE — Telephone Encounter (Signed)
-----   Message from Emily Filbert, MD sent at 06/29/2018  8:23 AM EDT ----- Can you please make her an appt with MFM to discuss her elevated AFP. Thanks If you could also send me the proper way to email the pool at [facility], I will do that next time. Sorry to bother you.

## 2018-07-09 NOTE — Telephone Encounter (Signed)
Left message on vm for patient for return call to the office

## 2018-07-12 ENCOUNTER — Other Ambulatory Visit: Payer: Self-pay

## 2018-07-12 MED ORDER — METRONIDAZOLE 0.75 % VA GEL: 1.0000 | Freq: Every day | VAGINAL | 0 refills | Status: AC

## 2018-07-15 ENCOUNTER — Telehealth: Payer: Self-pay

## 2018-07-15 NOTE — Telephone Encounter (Signed)
Letter approved. Pt informed to stay safe during pregnancy and always wear her seatbelt.
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
## 2018-07-22 ENCOUNTER — Telehealth (INDEPENDENT_AMBULATORY_CARE_PROVIDER_SITE_OTHER): Payer: Medicaid Other | Admitting: Certified Nurse Midwife

## 2018-07-22 ENCOUNTER — Encounter: Payer: Self-pay | Admitting: Certified Nurse Midwife

## 2018-07-22 DIAGNOSIS — Z3402 Encounter for supervision of normal first pregnancy, second trimester: Secondary | ICD-10-CM

## 2018-07-22 DIAGNOSIS — Z3A23 23 weeks gestation of pregnancy: Secondary | ICD-10-CM | POA: Diagnosis not present
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
## 2018-07-29 ENCOUNTER — Other Ambulatory Visit: Payer: Self-pay

## 2018-07-29 ENCOUNTER — Encounter (HOSPITAL_COMMUNITY): Payer: Self-pay | Admitting: *Deleted

## 2018-07-29 ENCOUNTER — Inpatient Hospital Stay (HOSPITAL_COMMUNITY)
Admission: AD | Admit: 2018-07-29 | Discharge: 2018-07-29 | Disposition: A | Discharge status: Home / Self Care | Payer: Medicaid Other | Attending: Obstetrics and Gynecology | Admitting: Obstetrics and Gynecology

## 2018-07-29 DIAGNOSIS — R102 Pelvic and perineal pain unspecified side: Secondary | ICD-10-CM

## 2018-07-29 DIAGNOSIS — B3731 Acute candidiasis of vulva and vagina: Secondary | ICD-10-CM

## 2018-07-29 DIAGNOSIS — O98812 Other maternal infectious and parasitic diseases complicating pregnancy, second trimester: Secondary | ICD-10-CM | POA: Diagnosis not present

## 2018-07-29 DIAGNOSIS — R350 Frequency of micturition: Secondary | ICD-10-CM

## 2018-07-29 DIAGNOSIS — O26892 Other specified pregnancy related conditions, second trimester: Secondary | ICD-10-CM

## 2018-07-29 DIAGNOSIS — Z3A24 24 weeks gestation of pregnancy: Secondary | ICD-10-CM | POA: Insufficient documentation

## 2018-07-29 DIAGNOSIS — O4702 False labor before 37 completed weeks of gestation, second trimester: Secondary | ICD-10-CM | POA: Insufficient documentation

## 2018-07-29 DIAGNOSIS — R12 Heartburn: Secondary | ICD-10-CM

## 2018-07-29 DIAGNOSIS — N3941 Urge incontinence: Secondary | ICD-10-CM

## 2018-07-29 DIAGNOSIS — Z6790 Unspecified blood type, Rh positive: Secondary | ICD-10-CM

## 2018-07-29 DIAGNOSIS — O4692 Antepartum hemorrhage, unspecified, second trimester: Secondary | ICD-10-CM | POA: Diagnosis not present

## 2018-07-29 DIAGNOSIS — O23592 Infection of other part of genital tract in pregnancy, second trimester: Secondary | ICD-10-CM

## 2018-07-29 DIAGNOSIS — O26899 Other specified pregnancy related conditions, unspecified trimester: Secondary | ICD-10-CM

## 2018-07-29 DIAGNOSIS — B373 Candidiasis of vulva and vagina: Secondary | ICD-10-CM | POA: Insufficient documentation

## 2018-07-29 DIAGNOSIS — O479 False labor, unspecified: Secondary | ICD-10-CM

## 2018-07-29 DIAGNOSIS — R0789 Other chest pain: Secondary | ICD-10-CM

## 2018-07-29 LAB — WET PREP, GENITAL
Clue Cells Wet Prep HPF POC: NONE SEEN
Sperm: NONE SEEN
Trich, Wet Prep: NONE SEEN
Yeast Wet Prep HPF POC: NONE SEEN

## 2018-07-29 LAB — CBC
HCT: 32.6 % — ABNORMAL LOW (ref 36.0–46.0)
Hemoglobin: 10.6 g/dL — ABNORMAL LOW (ref 12.0–15.0)
MCH: 28.1 pg (ref 26.0–34.0)
MCHC: 32.5 g/dL (ref 30.0–36.0)
MCV: 86.5 fL (ref 80.0–100.0)
Platelets: 288 10*3/uL (ref 150–400)
RBC: 3.77 MIL/uL — ABNORMAL LOW (ref 3.87–5.11)
RDW: 13 % (ref 11.5–15.5)
WBC: 8.1 10*3/uL (ref 4.0–10.5)
nRBC: 0 % (ref 0.0–0.2)

## 2018-07-29 LAB — URINALYSIS, ROUTINE W REFLEX MICROSCOPIC
Bilirubin Urine: NEGATIVE
Glucose, UA: NEGATIVE mg/dL
Hgb urine dipstick: NEGATIVE
Ketones, ur: 80 mg/dL — AB
Nitrite: NEGATIVE
Protein, ur: 100 mg/dL — AB
Specific Gravity, Urine: 1.027 (ref 1.005–1.030)
Squamous Epithelial / HPF: >50 — ABNORMAL HIGH (ref 0–5)
pH: 5 (ref 5.0–8.0)

## 2018-07-29 LAB — FETAL FIBRONECTIN: Fetal Fibronectin: NEGATIVE

## 2018-07-29 MED ORDER — FAMOTIDINE 20 MG PO TABS: 20.0000 mg | ORAL_TABLET | Freq: Two times a day (BID) | ORAL | 1 refills | Status: DC

## 2018-07-29 MED ORDER — TERCONAZOLE 0.4 % VA CREA: 1.0000 | TOPICAL_CREAM | Freq: Every day | VAGINAL | 0 refills | Status: AC

## 2018-07-29 NOTE — Discharge Instructions (Signed)
Vaginal Yeast infection, Adult  Vaginal yeast infection is a condition that causes vaginal discharge as well as soreness, swelling, and redness (inflammation) of the vagina. This is a common condition. Some women get this infection frequently. What are the causes? This condition is caused by a change in the normal balance of the yeast (candida) and bacteria that live in the vagina. This change causes an overgrowth of yeast, which causes the inflammation. What increases the risk? The condition is more likely to develop in women who:  Take antibiotic medicines.  Have diabetes.  Take birth control pills.  Are pregnant.  Douche often.  Have a weak body defense system (immune system).  Have been taking steroid medicines for a long time.  Frequently wear tight clothing. What are the signs or symptoms? Symptoms of this condition include:  White, thick, creamy vaginal discharge.  Swelling, itching, redness, and irritation of the vagina. The lips of the vagina (vulva) may be affected as well.  Pain or a burning feeling while urinating.  Pain during sex. How is this diagnosed? This condition is diagnosed based on:  Your medical history.  A physical exam.  A pelvic exam. Your health care provider will examine a sample of your vaginal discharge under a microscope. Your health care provider may send this sample for testing to confirm the diagnosis. How is this treated? This condition is treated with medicine. Medicines may be over-the-counter or prescription. You may be told to use one or more of the following:  Medicine that is taken by mouth (orally).  Medicine that is applied as a cream (topically).  Medicine that is inserted directly into the vagina (suppository). Follow these instructions at home:  Lifestyle  Do not have sex until your health care provider approves. Tell your sex partner that you have a yeast infection. That person should go to his or her health care  provider and ask if they should also be treated.  Do not wear tight clothes, such as pantyhose or tight pants.  Wear breathable cotton underwear. General instructions  Take or apply over-the-counter and prescription medicines only as told by your health care provider.  Eat more yogurt. This may help to keep your yeast infection from returning.  Do not use tampons until your health care provider approves.  Try taking a sitz bath to help with discomfort. This is a warm water bath that is taken while you are sitting down. The water should only come up to your hips and should cover your buttocks. Do this 3-4 times per day or as told by your health care provider.  Do not douche.  If you have diabetes, keep your blood sugar levels under control.  Keep all follow-up visits as told by your health care provider. This is important. Contact a health care provider if:  You have a fever.  Your symptoms go away and then return.  Your symptoms do not get better with treatment.  Your symptoms get worse.  You have new symptoms.  You develop blisters in or around your vagina.  You have blood coming from your vagina and it is not your menstrual period.  You develop pain in your abdomen. Summary  Vaginal yeast infection is a condition that causes discharge as well as soreness, swelling, and redness (inflammation) of the vagina.  This condition is treated with medicine. Medicines may be over-the-counter or prescription.  Take or apply over-the-counter and prescription medicines only as told by your health care provider.  Do not douche.  Do not have sex or use tampons until your health care provider approves.  Contact a health care provider if your symptoms do not get better with treatment or your symptoms go away and then return. This information is not intended to replace advice given to you by your health care provider. Make sure you discuss any questions you have with your health care  provider. Document Released: 10/16/2004 Document Revised: 05/25/2017 Document Reviewed: 05/25/2017 Elsevier Patient Education  2020 [employer] Inc.   Pregnancy and Urinary Tract Infection  A urinary tract infection (UTI) is an infection of any part of the urinary tract. This includes the kidneys, the tubes that connect your kidneys to your bladder (ureters), the bladder, and the tube that carries urine out of your body (urethra). These organs make, store, and get rid of urine in the body. Your health care provider may use other names to describe the infection. An upper UTI affects the ureters and kidneys (pyelonephritis). A lower UTI affects the bladder (cystitis) and urethra (urethritis). Most urinary tract infections are caused by bacteria in your genital area, around the entrance to your urinary tract (urethra). These bacteria grow and cause irritation and inflammation of your urinary tract. You are more likely to develop a UTI during pregnancy because the physical and hormonal changes your body goes through can make it easier for bacteria to get into your urinary tract. Your growing baby also puts pressure on your bladder and can affect urine flow. It is important to recognize and treat UTIs in pregnancy because of the risk of serious complications for both you and your baby. How does this affect me? Symptoms of a UTI include:  Needing to urinate right away (urgently).  Frequent urination or passing small amounts of urine frequently.  Pain or burning with urination.  Blood in the urine.  Urine that smells bad or unusual.  Trouble urinating.  Cloudy urine.  Pain in the abdomen or lower back.  Vaginal discharge. You may also have:  Vomiting or a decreased appetite.  Confusion.  Irritability or tiredness.  A fever.  Diarrhea. How does this affect my baby? An untreated UTI during pregnancy could lead to a kidney infection or a systemic infection, which can cause health  problems that could affect your baby. Possible complications of an untreated UTI include:  Giving birth to your baby before 37 weeks of pregnancy (premature).  Having a baby with a low birth weight.  Developing high blood pressure during pregnancy (preeclampsia).  Having a low hemoglobin level (anemia). What can I do to lower my risk? To prevent a UTI:  Go to the bathroom as soon as you feel the need. Do not hold urine for long periods of time.  Always wipe from front to back, especially after a bowel movement. Use each tissue one time when you wipe.  Empty your bladder after sex.  Keep your genital area dry.  Drink 6-10 glasses of water each day.  Do not douche or use deodorant sprays. How is this treated? Treatment for this condition may include:  Antibiotic medicines that are safe to take during pregnancy.  Other medicines to treat less common causes of UTI. Follow these instructions at home:  If you were prescribed an antibiotic medicine, take it as told by your health care provider. Do not stop using the antibiotic even if you start to feel better.  Keep all follow-up visits as told by your health care provider. This is important. Contact a health care provider  if:  Your symptoms do not improve or they get worse.  You have abnormal vaginal discharge. Get help right away if you:  Have a fever.  Have nausea and vomiting.  Have back or side pain.  Feel contractions in your uterus.  Have lower belly pain.  Have a gush of fluid from your vagina.  Have blood in your urine. Summary  A urinary tract infection (UTI) is an infection of any part of the urinary tract, which includes the kidneys, ureters, bladder, and urethra.  Most urinary tract infections are caused by bacteria in your genital area, around the entrance to your urinary tract (urethra).  You are more likely to develop a UTI during pregnancy.  If you were prescribed an antibiotic medicine, take it  as told by your health care provider. Do not stop using the antibiotic even if you start to feel better. This information is not intended to replace advice given to you by your health care provider. Make sure you discuss any questions you have with your health care provider. Document Released: 05/03/2010 Document Revised: 04/30/2018 Document Reviewed: 12/10/2017 Elsevier Patient Education  2020 [employer]  Preterm Labor and Birth Information  The normal length of a pregnancy is 39-41 weeks. Preterm labor is when labor starts before 37 completed weeks of pregnancy. What are the risk factors for preterm labor? Preterm labor is more likely to occur in women who:  Have certain infections during pregnancy such as a bladder infection, sexually transmitted infection, or infection inside the uterus (chorioamnionitis).  Have a shorter-than-normal cervix.  Have gone into preterm labor before.  Have had surgery on their cervix.  Are younger than age 21 or older than age 21.  Are African American.  Are pregnant with twins or multiple babies (multiple gestation).  Take street drugs or smoke while pregnant.  Do not gain enough weight while pregnant.  Became pregnant shortly after having been pregnant. What are the symptoms of preterm labor? Symptoms of preterm labor include:  Cramps similar to those that can happen during a menstrual period. The cramps may happen with diarrhea.  Pain in the abdomen or lower back.  Regular uterine contractions that may feel like tightening of the abdomen.  A feeling of increased pressure in the pelvis.  Increased watery or bloody mucus discharge from the vagina.  Water breaking (ruptured amniotic sac). Why is it important to recognize signs of preterm labor? It is important to recognize signs of preterm labor because babies who are born prematurely may not be fully developed. This can put them at an increased risk for:  Long-term (chronic) heart  and lung problems.  Difficulty immediately after birth with regulating body systems, including blood sugar, body temperature, heart rate, and breathing rate.  Bleeding in the brain.  Cerebral palsy.  Learning difficulties.  Death. These risks are highest for babies who are born before 34 weeks of pregnancy. How is preterm labor treated? Treatment depends on the length of your pregnancy, your condition, and the health of your baby. It may involve:  Having a stitch (suture) placed in your cervix to prevent your cervix from opening too early (cerclage).  Taking or being given medicines, such as: ? Hormone medicines. These may be given early in pregnancy to help support the pregnancy. ? Medicine to stop contractions. ? Medicines to help mature the babys lungs. These may be prescribed if the risk of delivery is high. ? Medicines to prevent your baby from developing cerebral palsy. If the  labor happens before 34 weeks of pregnancy, you may need to stay in the hospital. What should I do if I think I am in preterm labor? If you think that you are going into preterm labor, call your health care provider right away. How can I prevent preterm labor in future pregnancies? To increase your chance of having a full-term pregnancy:  Do not use any tobacco products, such as cigarettes, chewing tobacco, and e-cigarettes. If you need help quitting, ask your health care provider.  Do not use street drugs or medicines that have not been prescribed to you during your pregnancy.  Talk with your health care provider before taking any herbal supplements, even if you have been taking them regularly.  Make sure you gain a healthy amount of weight during your pregnancy.  Watch for infection. If you think that you might have an infection, get it checked right away.  Make sure to tell your health care provider if you have gone into preterm labor before. This information is not intended to replace advice  given to you by your health care provider. Make sure you discuss any questions you have with your health care provider. Document Released: 03/29/2003 Document Revised: 04/30/2018 Document Reviewed: 05/30/2015 Elsevier Patient Education  2020 [employer].
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
## 2018-07-29 NOTE — MAU Note (Signed)
Patient reports increased vaginal pressure that started yesterday and CTX that started this AM around 0430.  At that time she went to the bathroom and had dark red blood when she wiped.  Not bleeding now.  Also had "gush of water--I think I peed myself."  Not leaking now.  Feeling CTX about every 8 min.  Continued vaginal pressure.  Not felt baby move this morning.

## 2018-07-30 LAB — CULTURE, OB URINE: Culture: <10000 — AB

## 2018-07-30 LAB — GC/CHLAMYDIA PROBE AMP (~~LOC~~) NOT AT ARMC
Chlamydia: NEGATIVE
Neisseria Gonorrhea: NEGATIVE

## 2018-08-19 ENCOUNTER — Other Ambulatory Visit: Payer: Self-pay

## 2018-08-19 ENCOUNTER — Other Ambulatory Visit: Payer: Medicaid Other

## 2018-08-19 ENCOUNTER — Ambulatory Visit (INDEPENDENT_AMBULATORY_CARE_PROVIDER_SITE_OTHER): Payer: Medicaid Other | Admitting: Certified Nurse Midwife

## 2018-08-19 ENCOUNTER — Encounter: Payer: Self-pay | Admitting: Certified Nurse Midwife

## 2018-08-19 VITALS — BP 120/75 | HR 92 | Wt 198.0 lb

## 2018-08-19 DIAGNOSIS — Z3402 Encounter for supervision of normal first pregnancy, second trimester: Secondary | ICD-10-CM

## 2018-08-19 DIAGNOSIS — Z3A27 27 weeks gestation of pregnancy: Secondary | ICD-10-CM

## 2018-08-19 DIAGNOSIS — Z3400 Encounter for supervision of normal first pregnancy, unspecified trimester: Secondary | ICD-10-CM

## 2018-08-19 NOTE — Progress Notes (Signed)
   PRENATAL VISIT NOTE  Subjective:  Virginia Bennett is a 23 y.o. G1P0 at [redacted]w[redacted]d being seen today for ongoing prenatal care.  She is currently monitored for the following issues for this low-risk pregnancy and has BMI (body mass index), pediatric, greater than or equal to 95% for age; Acne vulgaris; Slow transit constipation; Surveillance of implantable subdermal contraceptive; History of ADHD; MDD (major depressive disorder), single episode, severe with psychosis ([place]); Adjustment disorder, unspecified; MDD (major depressive disorder), single episode with atypical features; Supervision of normal first pregnancy; and Full-thickness laceration of eyelid, not involving lacrimal drainage structures, right, initial encounter on their problem list.  Patient reports no complaints.  Contractions: Not present. Vag. Bleeding: None.  Movement: Present. Denies leaking of fluid.   The following portions of the patient's history were reviewed and updated as appropriate: allergies, current medications, past family history, past medical history, past social history, past surgical history and problem list.   Objective:   Vitals:   08/19/18 0826  BP: 120/75  Pulse: 92  Weight: 198 lb (89.8 kg)    Fetal Status: Fetal Heart Rate (bpm): 150 Fundal Height: 29 cm Movement: Present     General:  Alert, oriented and cooperative. Patient is in no acute distress.  Skin: Skin is warm and dry. No rash noted.   Cardiovascular: Normal heart rate noted  Respiratory: Normal respiratory effort, no problems with respiration noted  Abdomen: Soft, gravid, appropriate for gestational age.  Pain/Pressure: Present     Pelvic: Cervical exam deferred        Extremities: Normal range of motion.  Edema: Trace  Mental Status: Normal mood and affect. Normal behavior. Normal judgment and thought content.   Assessment and Plan:  Pregnancy: G1P0 at [redacted]w[redacted]d 1. Supervision of normal first pregnancy, antepartum - Patient doing well,  no complaints - Anticipatory guidance on upcoming appointments with next being MyChart appointment  - Patient got new phone as last one was broken, [employer] is not working on new phone, has been taking pictures of weekly BPs- instructed to enter into MyChart if [employer] continues to not work after calling customer care, patient verbalizes understanding  - Routine prenatal care  - Glucose Tolerance, 2 Hours w/1 Hour - CBC - HIV Antibody (routine testing w rflx) - RPR  Preterm labor symptoms and general obstetric precautions including but not limited to vaginal bleeding, contractions, leaking of fluid and fetal movement were reviewed in detail with the patient. Please refer to After Visit Summary for other counseling recommendations.   Return in about 4 weeks (around 09/16/2018) for ROB-mychart.  Future Appointments  Date Time Provider [place]  09/16/2018 10:45 AM Lajean Manes, CNM [place] None    Lajean Manes, CNM

## 2018-08-19 NOTE — Progress Notes (Signed)
Pt states she is doing well, no complaints today. Declines Tdap today, may get at another time.

## 2018-08-19 NOTE — Patient Instructions (Addendum)
Glucose Tolerance Test During Pregnancy Why am I having this test? The glucose tolerance test (GTT) is done to check how your body processes sugar (glucose). This is one of several tests used to diagnose diabetes that develops during pregnancy (gestational diabetes mellitus). Gestational diabetes is a temporary form of diabetes that some women develop during pregnancy. It usually occurs during the second trimester of pregnancy and goes away after delivery. Testing (screening) for gestational diabetes usually occurs between 24 and 28 weeks of pregnancy. You may have the GTT test after having a 1-hour glucose screening test if the results from that test indicate that you may have gestational diabetes. You may also have this test if:  You have a history of gestational diabetes.  You have a history of giving birth to very large babies or have experienced repeated fetal loss (stillbirth).  You have signs and symptoms of diabetes, such as: ? Changes in your vision. ? Tingling or numbness in your hands or feet. ? Changes in hunger, thirst, and urination that are not otherwise explained by your pregnancy. What is being tested? This test measures the amount of glucose in your blood at different times during a period of 3 hours. This indicates how well your body is able to process glucose. What kind of sample is taken?  Blood samples are required for this test. They are usually collected by inserting a needle into a blood vessel. How do I prepare for this test?  For 3 days before your test, eat normally. Have plenty of carbohydrate-rich foods.  Follow instructions from your health care provider about: ? Eating or drinking restrictions on the day of the test. You may be asked to not eat or drink anything other than water (fast) starting 8-10 hours before the test. ? Changing or stopping your regular medicines. Some medicines may interfere with this test. Tell a health care provider about:  All  medicines you are taking, including vitamins, herbs, eye drops, creams, and over-the-counter medicines.  Any blood disorders you have.  Any surgeries you have had.  Any medical conditions you have. What happens during the test? First, your blood glucose will be measured. This is referred to as your fasting blood glucose, since you fasted before the test. Then, you will drink a glucose solution that contains a certain amount of glucose. Your blood glucose will be measured again 1, 2, and 3 hours after drinking the solution. This test takes about 3 hours to complete. You will need to stay at the testing location during this time. During the testing period:  Do not eat or drink anything other than the glucose solution.  Do not exercise.  Do not use any products that contain nicotine or tobacco, such as cigarettes and e-cigarettes. If you need help stopping, ask your health care provider. The testing procedure may vary among health care providers and hospitals. How are the results reported? Your results will be reported as milligrams of glucose per deciliter of blood (mg/dL) or millimoles per liter (mmol/L). Your health care provider will compare your results to normal ranges that were established after testing a large group of people (reference ranges). Reference ranges may vary among labs and hospitals. For this test, common reference ranges are:  Fasting: less than 95-105 mg/dL (5.3-5.8 mmol/L).  1 hour after drinking glucose: less than 180-190 mg/dL (10.0-10.5 mmol/L).  2 hours after drinking glucose: less than 155-165 mg/dL (8.6-9.2 mmol/L).  3 hours after drinking glucose: 140-145 mg/dL (7.8-8.1 mmol/L). What do the   results mean? Results within reference ranges are considered normal, meaning that your glucose levels are well-controlled. If two or more of your blood glucose levels are high, you may be diagnosed with gestational diabetes. If only one level is high, your health care  provider may suggest repeat testing or other tests to confirm a diagnosis. Talk with your health care provider about what your results mean. Questions to ask your health care provider Ask your health care provider, or the department that is doing the test:  When will my results be ready?  How will I get my results?  What are my treatment options?  What other tests do I need?  What are my next steps? Summary  The glucose tolerance test (GTT) is one of several tests used to diagnose diabetes that develops during pregnancy (gestational diabetes mellitus). Gestational diabetes is a temporary form of diabetes that some women develop during pregnancy.  You may have the GTT test after having a 1-hour glucose screening test if the results from that test indicate that you may have gestational diabetes. You may also have this test if you have any symptoms or risk factors for gestational diabetes.  Talk with your health care provider about what your results mean. This information is not intended to replace advice given to you by your health care provider. Make sure you discuss any questions you have with your health care provider. Document Released: 07/08/2011 Document Revised: 04/29/2018 Document Reviewed: 08/18/2016 Elsevier Patient Education  2020 [employer]  Places to have your son circumcised:                                                                      [facility]     [phone]   $510 while you are in hospital         [facility]              [phone]   $269 by 4 wks                      [facility]                     [phone]   $269 by 7 days [facility]                    [phone]   $269 by 4 wks [facility]             [phone]   $225 by 2 wks    These prices sometimes change but are roughly what you can expect to pay. Please call and confirm pricing.   Circumcision is considered an elective/non-medically necessary procedure. There are many reasons parents decide to have their  sons circumsized. During the first year of life circumcised males have a reduced risk of urinary tract infections but after this year the rates between circumcised males and uncircumcised males are the same.  It is safe to have your son circumcised outside of the hospital and the places above perform them regularly.   Deciding about Circumcision in Baby Boys  (Up-to-date The Basics)  What is circumcision?   Circumcision is a surgery that removes the skin that covers the tip of the  penis, called the "foreskin" Circumcision is usually done when a boy is between 54 and 40 days old. In the [place], circumcision is common. In some other countries, fewer boys are circumcised. Circumcision is a common tradition in some religions.  Should I have my baby boy circumcised?   There is no easy answer. Circumcision has some benefits. But it also has risks. After talking with your doctor, you will have to decide for yourself what is right for your family.  What are the benefits of circumcision?   Circumcised boys seem to have slightly lower rates of: ?Urinary tract infections ?Swelling of the opening at the tip of the penis Circumcised men seem to have slightly lower rates of: ?Urinary tract infections ?Swelling of the opening at the tip of the penis ?Penis cancer ?HIV and other infections that you catch during sex ?Cervical cancer in the women they have sex with Even so, in the [place], the risks of these problems are small - even in boys and men who have not been circumcised. Plus, boys and men who are not circumcised can reduce these extra risks by: ?Cleaning their penis well ?Using condoms during sex  What are the risks of circumcision?  Risks include: ?Bleeding or infection from the surgery ?Damage to or amputation of the penis ?A chance that the doctor will cut off too much or not enough of the foreskin ?A chance that sex won't feel as good later in life Only about 1 out of every  200 circumcisions leads to problems. There is also a chance that your health insurance won't pay for circumcision.  How is circumcision done in baby boys?  First, the baby gets medicine for pain relief. This might be a cream on the skin or a shot into the base of the penis. Next, the doctor cleans the baby's penis well. Then he or she uses special tools to cut off the foreskin. Finally, the doctor wraps a bandage (called gauze) around the baby's penis. If you have your baby circumcised, his doctor or nurse will give you instructions on how to care for him after the surgery. It is important that you follow those instructions carefully.

## 2018-08-20 LAB — CBC
Hematocrit: 37 % (ref 34.0–46.6)
Hemoglobin: 11.7 g/dL (ref 11.1–15.9)
MCH: 28.3 pg (ref 26.6–33.0)
MCHC: 31.6 g/dL (ref 31.5–35.7)
MCV: 90 fL (ref 79–97)
Platelets: 310 10*3/uL (ref 150–450)
RBC: 4.13 x10E6/uL (ref 3.77–5.28)
RDW: 12 % (ref 11.7–15.4)
WBC: 12.2 10*3/uL — ABNORMAL HIGH (ref 3.4–10.8)

## 2018-08-20 LAB — RPR: RPR Ser Ql: NONREACTIVE

## 2018-08-20 LAB — HIV ANTIBODY (ROUTINE TESTING W REFLEX): HIV Screen 4th Generation wRfx: NONREACTIVE

## 2018-08-20 LAB — GLUCOSE TOLERANCE, 2 HOURS W/ 1HR
Glucose, 1 hour: 102 mg/dL (ref 65–179)
Glucose, 2 hour: 71 mg/dL (ref 65–152)
Glucose, Fasting: 77 mg/dL (ref 65–91)

## 2018-09-16 ENCOUNTER — Ambulatory Visit (INDEPENDENT_AMBULATORY_CARE_PROVIDER_SITE_OTHER): Payer: Medicaid Other | Admitting: Certified Nurse Midwife

## 2018-09-16 ENCOUNTER — Other Ambulatory Visit (HOSPITAL_COMMUNITY)
Admission: RE | Admit: 2018-09-16 | Discharge: 2018-09-16 | Disposition: A | Discharge status: Home / Self Care | Payer: Medicaid Other | Source: Ambulatory Visit | Attending: Certified Nurse Midwife | Admitting: Certified Nurse Midwife

## 2018-09-16 ENCOUNTER — Other Ambulatory Visit: Payer: Self-pay

## 2018-09-16 VITALS — BP 112/74 | HR 97 | Temp 99.0°F | Wt 202.8 lb

## 2018-09-16 DIAGNOSIS — Z3400 Encounter for supervision of normal first pregnancy, unspecified trimester: Secondary | ICD-10-CM

## 2018-09-16 DIAGNOSIS — N898 Other specified noninflammatory disorders of vagina: Secondary | ICD-10-CM

## 2018-09-16 DIAGNOSIS — O26899 Other specified pregnancy related conditions, unspecified trimester: Secondary | ICD-10-CM

## 2018-09-16 DIAGNOSIS — Z3A31 31 weeks gestation of pregnancy: Secondary | ICD-10-CM

## 2018-09-16 MED ORDER — METRONIDAZOLE 500 MG PO TABS: 500.0000 mg | ORAL_TABLET | Freq: Two times a day (BID) | ORAL | 0 refills | Status: DC

## 2018-09-16 MED ORDER — FLUCONAZOLE 150 MG PO TABS: 150.0000 mg | ORAL_TABLET | Freq: Every day | ORAL | 0 refills | Status: DC

## 2018-09-16 NOTE — Patient Instructions (Signed)

## 2018-09-16 NOTE — Progress Notes (Signed)
   PRENATAL VISIT NOTE  Subjective:  Virginia Bennett is a 23 y.o. G1P0 at [redacted]w[redacted]d being seen today for ongoing prenatal care. She is currently monitored for the following issues for this low-risk pregnancy and has BMI (body mass index), pediatric, greater than or equal to 95% for age; Acne vulgaris; Slow transit constipation; Surveillance of implantable subdermal contraceptive; History of ADHD; MDD (major depressive disorder), single episode, severe with psychosis ([place]); Adjustment disorder, unspecified; MDD (major depressive disorder), single episode with atypical features; Supervision of normal first pregnancy; and Full-thickness laceration of eyelid, not involving lacrimal drainage structures, right, initial encounter on their problem list.  Patient reports spotting 4 days ago. Does not currently have any. Pt also reports a creamy white discharge, without odor or irritation. Contractions: Not present. Vag. Bleeding: None.  Movement: Present. Denies leaking of fluid.   The following portions of the patient's history were reviewed and updated as appropriate: allergies, current medications, past family history, past medical history, past social history, past surgical history and problem list.   Objective:   Vitals:   09/16/18 1105  BP: 112/74  Pulse: 97  Temp: 99 F (37.2 C)  Weight: 92 kg    Fetal Status: Fetal Heart Rate (bpm): 145 Fundal Height: 31 cm Movement: Present     General:  Alert, oriented and cooperative. Patient is in no acute distress.  Skin: Skin is warm and dry. No rash noted.   Cardiovascular: Normal heart rate noted  Respiratory: Normal respiratory effort, no problems with respiration noted  Abdomen: Soft, gravid, appropriate for gestational age.  Pain/Pressure: Present     Pelvic: Cervical exam deferred. Speculum exam shows no bleeding. Curdy, white discharge on vaginal mucosa. No lesions.  Dilation: Closed      Extremities: Normal range of motion.  Edema: Trace   Mental Status: Normal mood and affect. Normal behavior. Normal judgment and thought content.   Assessment and Plan:  Pregnancy: G1P0 at [redacted]w[redacted]d 1. Supervision of normal first pregnancy, antepartum - Routine prenatal care - Discussed increased PO hydration.  - Anticipatory guidance for upcoming appointments  2. Vaginal discharge during pregnancy, antepartum - Reports vaginal spotting 4 days ago. None currently - No bleeding on speculum exam. Some white curdy vaginal discharge noted along vaginal walls. - Cervix visually closed  - Cervicovaginal ancillary only( [place]) - metroNIDAZOLE (FLAGYL) 500 MG tablet; Take 1 tablet (500 mg total) by mouth 2 (two) times daily.  Dispense: 14 tablet; Refill: 0 - fluconazole (DIFLUCAN) 150 MG tablet; Take 1 tablet (150 mg total) by mouth daily.  Dispense: 1 tablet; Refill: 0  Preterm labor symptoms and general obstetric precautions including but not limited to vaginal bleeding, contractions, leaking of fluid and fetal movement were reviewed in detail with the patient. Please refer to After Visit Summary for other counseling recommendations.   Return in about 1 month (around 10/18/2018) for ROB/GBS.  Future Appointments  Date Time Provider [place]  10/18/2018  9:55 AM [place]-K[place], Kathie Dike, CNM [facility] None    Maryagnes Amos, SNM

## 2018-09-16 NOTE — Progress Notes (Signed)
Pt is here for ROB and symptoms of BV. [redacted]w[redacted]d.

## 2018-09-20 LAB — CERVICOVAGINAL ANCILLARY ONLY: Candida vaginitis: NEGATIVE

## 2018-09-23 ENCOUNTER — Other Ambulatory Visit: Payer: Self-pay

## 2018-09-23 ENCOUNTER — Inpatient Hospital Stay (HOSPITAL_COMMUNITY)
Admission: AD | Admit: 2018-09-23 | Discharge: 2018-09-23 | Disposition: A | Discharge status: Home / Self Care | Payer: Medicaid Other | Attending: Obstetrics & Gynecology | Admitting: Obstetrics & Gynecology

## 2018-09-23 DIAGNOSIS — Z3A32 32 weeks gestation of pregnancy: Secondary | ICD-10-CM | POA: Insufficient documentation

## 2018-09-23 DIAGNOSIS — Z0371 Encounter for suspected problem with amniotic cavity and membrane ruled out: Secondary | ICD-10-CM | POA: Insufficient documentation

## 2018-09-23 DIAGNOSIS — N898 Other specified noninflammatory disorders of vagina: Secondary | ICD-10-CM | POA: Diagnosis present

## 2018-09-23 LAB — URINALYSIS, ROUTINE W REFLEX MICROSCOPIC
Bilirubin Urine: NEGATIVE
Glucose, UA: NEGATIVE mg/dL
Hgb urine dipstick: NEGATIVE
Ketones, ur: NEGATIVE mg/dL
Nitrite: NEGATIVE
Protein, ur: NEGATIVE mg/dL
Specific Gravity, Urine: 1.032 — ABNORMAL HIGH (ref 1.005–1.030)
pH: 6 (ref 5.0–8.0)

## 2018-09-23 LAB — POCT FERN TEST: POCT Fern Test: NEGATIVE

## 2018-09-23 NOTE — MAU Note (Addendum)
Went to BR and urinated about 2400.  When stood up leaked a lot of liquid. Thinks may have leaked alittle more since then but not like that gush of fld when stood up from toilet. No pain but occ tightening that comes and goes.

## 2018-09-23 NOTE — Discharge Instructions (Signed)
Fetal Movement Counts °Patient Name: ________________________________________________ Patient Due Date: ____________________ °What is a fetal movement count? ° °A fetal movement count is the number of times that you feel your baby move during a certain amount of time. This may also be called a fetal kick count. A fetal movement count is recommended for every pregnant woman. You may be asked to start counting fetal movements as early as week 28 of your pregnancy. °Pay attention to when your baby is most active. You may notice your baby's sleep and wake cycles. You may also notice things that make your baby move more. You should do a fetal movement count: °· When your baby is normally most active. °· At the same time each day. °A good time to count movements is while you are resting, after having something to eat and drink. °How do I count fetal movements? °1. Find a quiet, comfortable area. Sit, or lie down on your side. °2. Write down the date, the start time and stop time, and the number of movements that you felt between those two times. Take this information with you to your health care visits. °3. For 2 hours, count kicks, flutters, swishes, rolls, and jabs. You should feel at least 10 movements during 2 hours. °4. You may stop counting after you have felt 10 movements. °5. If you do not feel 10 movements in 2 hours, have something to eat and drink. Then, keep resting and counting for 1 hour. If you feel at least 4 movements during that hour, you may stop counting. °Contact a health care provider if: °· You feel fewer than 4 movements in 2 hours. °· Your baby is not moving like he or she usually does. °Date: ____________ Start time: ____________ Stop time: ____________ Movements: ____________ °Date: ____________ Start time: ____________ Stop time: ____________ Movements: ____________ °Date: ____________ Start time: ____________ Stop time: ____________ Movements: ____________ °Date: ____________ Start time:  ____________ Stop time: ____________ Movements: ____________ °Date: ____________ Start time: ____________ Stop time: ____________ Movements: ____________ °Date: ____________ Start time: ____________ Stop time: ____________ Movements: ____________ °Date: ____________ Start time: ____________ Stop time: ____________ Movements: ____________ °Date: ____________ Start time: ____________ Stop time: ____________ Movements: ____________ °Date: ____________ Start time: ____________ Stop time: ____________ Movements: ____________ °This information is not intended to replace advice given to you by your health care provider. Make sure you discuss any questions you have with your health care provider. °Document Released: 02/05/2006 Document Revised: 01/26/2018 Document Reviewed: 02/15/2015 °Elsevier Patient Education © 2020 [employer] Inc. °Preterm Labor and Birth Information ° °The normal length of a pregnancy is 39-41 weeks. Preterm labor is when labor starts before 37 completed weeks of pregnancy. °What are the risk factors for preterm labor? °Preterm labor is more likely to occur in women who: °· Have certain infections during pregnancy such as a bladder infection, sexually transmitted infection, or infection inside the uterus (chorioamnionitis). °· Have a shorter-than-normal cervix. °· Have gone into preterm labor before. °· Have had surgery on their cervix. °· Are younger than age 17 or older than age 35. °· Are African American. °· Are pregnant with twins or multiple babies (multiple gestation). °· Take street drugs or smoke while pregnant. °· Do not gain enough weight while pregnant. °· Became pregnant shortly after having been pregnant. °What are the symptoms of preterm labor? °Symptoms of preterm labor include: °· Cramps similar to those that can happen during a menstrual period. The cramps may happen with diarrhea. °· Pain in the abdomen or lower   back. °· Regular uterine contractions that may feel like tightening of the  abdomen. °· A feeling of increased pressure in the pelvis. °· Increased watery or bloody mucus discharge from the vagina. °· Water breaking (ruptured amniotic sac). °Why is it important to recognize signs of preterm labor? °It is important to recognize signs of preterm labor because babies who are born prematurely may not be fully developed. This can put them at an increased risk for: °· Long-term (chronic) heart and lung problems. °· Difficulty immediately after birth with regulating body systems, including blood sugar, body temperature, heart rate, and breathing rate. °· Bleeding in the brain. °· Cerebral palsy. °· Learning difficulties. °· Death. °These risks are highest for babies who are born before 34 weeks of pregnancy. °How is preterm labor treated? °Treatment depends on the length of your pregnancy, your condition, and the health of your baby. It may involve: °· Having a stitch (suture) placed in your cervix to prevent your cervix from opening too early (cerclage). °· Taking or being given medicines, such as: °? Hormone medicines. These may be given early in pregnancy to help support the pregnancy. °? Medicine to stop contractions. °? Medicines to help mature the baby’s lungs. These may be prescribed if the risk of delivery is high. °? Medicines to prevent your baby from developing cerebral palsy. °If the labor happens before 34 weeks of pregnancy, you may need to stay in the hospital. °What should I do if I think I am in preterm labor? °If you think that you are going into preterm labor, call your health care provider right away. °How can I prevent preterm labor in future pregnancies? °To increase your chance of having a full-term pregnancy: °· Do not use any tobacco products, such as cigarettes, chewing tobacco, and e-cigarettes. If you need help quitting, ask your health care provider. °· Do not use street drugs or medicines that have not been prescribed to you during your pregnancy. °· Talk with your  health care provider before taking any herbal supplements, even if you have been taking them regularly. °· Make sure you gain a healthy amount of weight during your pregnancy. °· Watch for infection. If you think that you might have an infection, get it checked right away. °· Make sure to tell your health care provider if you have gone into preterm labor before. °This information is not intended to replace advice given to you by your health care provider. Make sure you discuss any questions you have with your health care provider. °Document Released: 03/29/2003 Document Revised: 04/30/2018 Document Reviewed: 05/30/2015 °Elsevier Patient Education © 2020 Elsevier Inc. ° °

## 2018-09-23 NOTE — MAU Provider Note (Signed)
First Provider Initiated Contact with Patient 09/23/18 0112       S: Ms. Virginia Bennett is a 23 y.o. G1P0 at [redacted]w[redacted]d  who presents to MAU today complaining of leaking of fluid since this evening; reports gush of fluid after urinating. Leaking has not continued. She denies vaginal bleeding. She denies contractions. She reports normal fetal movement.    O: BP (!) 115/56   Pulse 83   Temp 98.4 F (36.9 C)   Resp 18   Ht 5\' 4"  (1.626 m)   Wt 92.5 kg   LMP 02/07/2018   BMI 35.02 kg/m  GENERAL: Well-developed, well-nourished female in no acute distress.  HEAD: Normocephalic, atraumatic.  CHEST: Normal effort of breathing, regular heart rate ABDOMEN: Soft, nontender, gravid PELVIC: Normal external female genitalia. Vagina is pink and rugated. Cervix with normal contour, no lesions. Normal discharge.  No pooling.   Cervical exam: deferred  Fetal Monitoring: Baseline: 145 Variability: moderate Accelerations: 15x15 Decelerations: none Contractions: none  No results found for this or any previous visit (from the past 24 hour(s)).   A: [facility] at [redacted]w[redacted]d  Membranes intact  P: Discharge home  Discussed reasons to return to MAU Keep f/u with OB  Jorje Guild, NP 09/23/2018 1:13 AM

## 2018-09-23 NOTE — Progress Notes (Signed)
Written and verbal d/c instructions given and understanding voiced. 

## 2018-09-23 NOTE — Progress Notes (Signed)
OK per Jorje Guild NP to d/c [facility]

## 2018-09-28 ENCOUNTER — Other Ambulatory Visit: Payer: Self-pay

## 2018-09-28 MED ORDER — METRONIDAZOLE 0.75 % VA GEL: 1.0000 | Freq: Every day | VAGINAL | 0 refills | Status: DC

## 2018-09-28 NOTE — Progress Notes (Signed)
Rx changed to metrogel per pt request, pt notified.

## 2018-10-18 ENCOUNTER — Other Ambulatory Visit: Payer: Self-pay

## 2018-10-18 ENCOUNTER — Ambulatory Visit (INDEPENDENT_AMBULATORY_CARE_PROVIDER_SITE_OTHER): Payer: Medicaid Other | Admitting: Advanced Practice Midwife

## 2018-10-18 ENCOUNTER — Other Ambulatory Visit (HOSPITAL_COMMUNITY)
Admission: RE | Admit: 2018-10-18 | Discharge: 2018-10-18 | Disposition: A | Discharge status: Home / Self Care | Payer: Medicaid Other | Source: Ambulatory Visit | Attending: Advanced Practice Midwife | Admitting: Advanced Practice Midwife

## 2018-10-18 VITALS — BP 119/77 | HR 91 | Wt 201.0 lb

## 2018-10-18 DIAGNOSIS — Z3400 Encounter for supervision of normal first pregnancy, unspecified trimester: Secondary | ICD-10-CM | POA: Insufficient documentation

## 2018-10-18 DIAGNOSIS — K5900 Constipation, unspecified: Secondary | ICD-10-CM

## 2018-10-18 DIAGNOSIS — Z23 Encounter for immunization: Secondary | ICD-10-CM

## 2018-10-18 DIAGNOSIS — O99613 Diseases of the digestive system complicating pregnancy, third trimester: Secondary | ICD-10-CM

## 2018-10-18 DIAGNOSIS — Z3A36 36 weeks gestation of pregnancy: Secondary | ICD-10-CM

## 2018-10-18 DIAGNOSIS — O219 Vomiting of pregnancy, unspecified: Secondary | ICD-10-CM

## 2018-10-18 MED ORDER — DOCUSATE SODIUM 100 MG PO CAPS: 100.0000 mg | ORAL_CAPSULE | Freq: Two times a day (BID) | ORAL | 2 refills | Status: DC | PRN

## 2018-10-18 MED ORDER — DOXYLAMINE-PYRIDOXINE 10-10 MG PO TBEC: DELAYED_RELEASE_TABLET | ORAL | 5 refills | Status: DC

## 2018-10-18 NOTE — Patient Instructions (Signed)
Labor Precautions Reasons to come to MAU at [facility]:  1.  Contractions are  5 minutes apart or less, each last 1 minute, these have been going on for 1-2 hours, and you cannot walk or talk during them 2.  You have a large gush of fluid, or a trickle of fluid that will not stop and you have to wear a pad 3.  You have bleeding that is bright red, heavier than spotting--like menstrual bleeding (spotting can be normal in early labor or after a check of your cervix) 4.  You do not feel the baby moving like he/she normally does  

## 2018-10-18 NOTE — Progress Notes (Signed)
PRENATAL VISIT NOTE  Subjective:  Virginia Bennett is a 23 y.o. G1P0 at [redacted]w[redacted]d being seen today for ongoing prenatal care.  She is currently monitored for the following issues for this low-risk pregnancy and has BMI (body mass index), pediatric, greater than or equal to 95% for age; Acne vulgaris; Slow transit constipation; Surveillance of implantable subdermal contraceptive; History of ADHD; MDD (major depressive disorder), single episode, severe with psychosis ([place]); Adjustment disorder, unspecified; MDD (major depressive disorder), single episode with atypical features; Supervision of normal first pregnancy; and Full-thickness laceration of eyelid, not involving lacrimal drainage structures, right, initial encounter on their problem list.  Patient reports some mild pelvic and vaginal cramping and abdominal tightness. She also reports some round ligament pain. [name] has had N/V throughout her pregnancy, and has recently been trying zofran for N/V. She usually vomits right after taking the zofran. She is trying to drink ~4-5 bottles of water/day. She reports having two headaches last week, that lasted for ~30 min and went away on their own without Tylenol. Describes tension type headaches. Denies photophobia and phonophobia. She also notes that she's been constipated for about 2 weeks and unable to have a full bowel movement. No diarrhea.  Contractions: Not present. Vag. Bleeding: None.  Movement: Present. Denies bleeding, leaking of fluid, and changes in vaginal discharge.   Denies dizziness, vision changes, swelling in face and hands, rapid weight gain, chest pain, and SOB. The following portions of the patient's history were reviewed and updated as appropriate: allergies, current medications, past family history, past medical history, past social history, past surgical history and problem list.   Objective:   Vitals:   10/18/18 1005  BP: 119/77  Pulse: 91  Weight: 201 lb (91.2 kg)     Fetal Status: Fetal Heart Rate (bpm): 145   Movement: Present     General:  Alert, oriented and cooperative. Patient is in no acute distress.  Skin: Skin is warm and dry. No rash noted.   Cardiovascular: Normal heart rate noted  Respiratory: Normal respiratory effort, no problems with respiration noted  Abdomen: Soft, gravid, appropriate for gestational age.  Pain/Pressure: Present     Pelvic: Cervical exam performed        Extremities: Normal range of motion.     Mental Status: Normal mood and affect. Normal behavior. Normal judgment and thought content.   Assessment and Plan:  Pregnancy: G1P0 at [redacted]w[redacted]d 1. Supervision of normal first pregnancy, antepartum - Advised pt to take Tylenol for headaches. No concern for preeclampsia, as she has been normotensive throughout her pregnancy and denies other symptoms related to preeclampsia. Advised pt to continue checking BP at home.  - Recommended increasing fiber food intake and water to help with constipation. Prescribed Colace to soften stools.  - Recommended that pt stop taking Zofran as this can worsen constipation. - Will prescribe Diclegis for N/V. Educated pt on how to take this.  - Counseled pt on flu and TdaP vaccinations. Pt will get TdaP today and will ask the other members in her household to get vaccinated as well. She wants to keep thinking about getting the flu shot.  - Strep Gp B NAA - Cervicovaginal ancillary only( [place]) - Tdap vaccine greater than or equal to 7yo IM  Term labor symptoms and general obstetric precautions including but not limited to vaginal bleeding, contractions, leaking of fluid and fetal movement were reviewed in detail with the patient. Please refer to After Visit Summary for other counseling recommendations.  Return in about 2 weeks (around 11/01/2018).  No future appointments.  Osvaldo Shipper, Medical Student

## 2018-10-19 LAB — CERVICOVAGINAL ANCILLARY ONLY
Chlamydia: NEGATIVE
Molecular Disclaimer: NEGATIVE
Molecular Disclaimer: NORMAL
Neisseria Gonorrhea: NEGATIVE

## 2018-10-20 LAB — STREP GP B NAA: Strep Gp B NAA: NEGATIVE

## 2018-10-31 ENCOUNTER — Inpatient Hospital Stay (HOSPITAL_COMMUNITY): Payer: Medicaid Other

## 2018-10-31 ENCOUNTER — Other Ambulatory Visit: Payer: Self-pay

## 2018-10-31 ENCOUNTER — Inpatient Hospital Stay (HOSPITAL_COMMUNITY): Payer: Medicaid Other | Admitting: Anesthesiology

## 2018-10-31 ENCOUNTER — Encounter (HOSPITAL_COMMUNITY): Payer: Self-pay

## 2018-10-31 ENCOUNTER — Inpatient Hospital Stay (HOSPITAL_COMMUNITY)
Admission: AD | Admit: 2018-10-31 | Discharge: 2018-11-02 | DRG: 806 | Disposition: A | Discharge status: Home / Self Care | Payer: Medicaid Other | Attending: Obstetrics & Gynecology | Admitting: Obstetrics & Gynecology

## 2018-10-31 DIAGNOSIS — D563 Thalassemia minor: Secondary | ICD-10-CM | POA: Diagnosis present

## 2018-10-31 DIAGNOSIS — O4202 Full-term premature rupture of membranes, onset of labor within 24 hours of rupture: Secondary | ICD-10-CM

## 2018-10-31 DIAGNOSIS — F1290 Cannabis use, unspecified, uncomplicated: Secondary | ICD-10-CM | POA: Diagnosis present

## 2018-10-31 DIAGNOSIS — O4290 Premature rupture of membranes, unspecified as to length of time between rupture and onset of labor, unspecified weeks of gestation: Secondary | ICD-10-CM | POA: Diagnosis present

## 2018-10-31 DIAGNOSIS — Z20828 Contact with and (suspected) exposure to other viral communicable diseases: Secondary | ICD-10-CM | POA: Diagnosis present

## 2018-10-31 DIAGNOSIS — Z3A38 38 weeks gestation of pregnancy: Secondary | ICD-10-CM

## 2018-10-31 DIAGNOSIS — O99324 Drug use complicating childbirth: Secondary | ICD-10-CM

## 2018-10-31 DIAGNOSIS — Z3400 Encounter for supervision of normal first pregnancy, unspecified trimester: Secondary | ICD-10-CM

## 2018-10-31 DIAGNOSIS — O99344 Other mental disorders complicating childbirth: Principal | ICD-10-CM | POA: Diagnosis present

## 2018-10-31 DIAGNOSIS — Z30017 Encounter for initial prescription of implantable subdermal contraceptive: Secondary | ICD-10-CM | POA: Diagnosis not present

## 2018-10-31 DIAGNOSIS — F1210 Cannabis abuse, uncomplicated: Secondary | ICD-10-CM

## 2018-10-31 DIAGNOSIS — O26893 Other specified pregnancy related conditions, third trimester: Secondary | ICD-10-CM | POA: Diagnosis present

## 2018-10-31 DIAGNOSIS — Z915 Personal history of self-harm: Secondary | ICD-10-CM

## 2018-10-31 DIAGNOSIS — F329 Major depressive disorder, single episode, unspecified: Secondary | ICD-10-CM | POA: Diagnosis present

## 2018-10-31 DIAGNOSIS — Z9151 Personal history of suicidal behavior: Secondary | ICD-10-CM

## 2018-10-31 HISTORY — DX: Thalassemia minor: D56.3

## 2018-10-31 HISTORY — DX: Adjustment disorder, unspecified: F43.20

## 2018-10-31 LAB — URINALYSIS, ROUTINE W REFLEX MICROSCOPIC
Bacteria, UA: NONE SEEN
Bilirubin Urine: NEGATIVE
Glucose, UA: NEGATIVE mg/dL
Ketones, ur: NEGATIVE mg/dL
Leukocytes,Ua: NEGATIVE
Nitrite: NEGATIVE
Protein, ur: 30 mg/dL — AB
Specific Gravity, Urine: 1.017 (ref 1.005–1.030)
pH: 7 (ref 5.0–8.0)

## 2018-10-31 LAB — CBC WITH DIFFERENTIAL/PLATELET
Abs Immature Granulocytes: 0.14 10*3/uL — ABNORMAL HIGH (ref 0.00–0.07)
Basophils Absolute: 0 10*3/uL (ref 0.0–0.1)
Basophils Relative: 0 %
Eosinophils Absolute: 0.1 10*3/uL (ref 0.0–0.5)
Eosinophils Relative: 0 %
HCT: 39.5 % (ref 36.0–46.0)
Hemoglobin: 13.1 g/dL (ref 12.0–15.0)
Immature Granulocytes: 1 %
Lymphocytes Relative: 16 %
Lymphs Abs: 2 10*3/uL (ref 0.7–4.0)
MCH: 28.7 pg (ref 26.0–34.0)
MCHC: 33.2 g/dL (ref 30.0–36.0)
MCV: 86.6 fL (ref 80.0–100.0)
Monocytes Absolute: 1.2 10*3/uL — ABNORMAL HIGH (ref 0.1–1.0)
Monocytes Relative: 10 %
Neutro Abs: 8.9 10*3/uL — ABNORMAL HIGH (ref 1.7–7.7)
Neutrophils Relative %: 73 %
Platelets: 302 10*3/uL (ref 150–400)
RBC: 4.56 MIL/uL (ref 3.87–5.11)
RDW: 13.6 % (ref 11.5–15.5)
WBC: 12.3 10*3/uL — ABNORMAL HIGH (ref 4.0–10.5)
nRBC: 0 % (ref 0.0–0.2)

## 2018-10-31 LAB — AMYLASE: Amylase: 128 U/L — ABNORMAL HIGH (ref 28–100)

## 2018-10-31 LAB — BASIC METABOLIC PANEL
Anion gap: 13 (ref 5–15)
BUN: 7 mg/dL (ref 6–20)
CO2: 18 mmol/L — ABNORMAL LOW (ref 22–32)
Calcium: 9 mg/dL (ref 8.9–10.3)
Chloride: 109 mmol/L (ref 98–111)
Creatinine, Ser: 0.78 mg/dL (ref 0.44–1.00)
GFR calc Af Amer: >60 mL/min (ref >60)
GFR calc non Af Amer: >60 mL/min (ref >60)
Glucose, Bld: 87 mg/dL (ref 70–99)
Potassium: 3.8 mmol/L (ref 3.5–5.1)
Sodium: 140 mmol/L (ref 135–145)

## 2018-10-31 LAB — AMNISURE RUPTURE OF MEMBRANE (ROM) NOT AT ARMC: Amnisure ROM: POSITIVE

## 2018-10-31 LAB — RAPID URINE DRUG SCREEN, HOSP PERFORMED
Amphetamines: NOT DETECTED
Barbiturates: NOT DETECTED
Benzodiazepines: NOT DETECTED
Cocaine: NOT DETECTED
Opiates: NOT DETECTED
Tetrahydrocannabinol: POSITIVE — AB

## 2018-10-31 LAB — TYPE AND SCREEN
ABO/RH(D): A POS
Antibody Screen: NEGATIVE

## 2018-10-31 LAB — ABO/RH: ABO/RH(D): A POS

## 2018-10-31 LAB — SARS CORONAVIRUS 2 BY RT PCR (HOSPITAL ORDER, PERFORMED IN ~~LOC~~ HOSPITAL LAB): SARS Coronavirus 2: NEGATIVE

## 2018-10-31 LAB — LIPASE, BLOOD: Lipase: 44 U/L (ref 11–51)

## 2018-10-31 MED ORDER — FENTANYL CITRATE (PF) 100 MCG/2ML IJ SOLN
100.0000 ug | INTRAMUSCULAR | Status: DC | PRN
  Administered 2018-10-31 (×2): 100 ug via INTRAVENOUS
  Filled 2018-10-31 (×2): qty 2

## 2018-10-31 MED ORDER — ACETAMINOPHEN 325 MG PO TABS: 650.0000 mg | ORAL_TABLET | ORAL | Status: DC | PRN

## 2018-10-31 MED ORDER — OXYCODONE-ACETAMINOPHEN 5-325 MG PO TABS: 1.0000 | ORAL_TABLET | ORAL | Status: DC | PRN

## 2018-10-31 MED ORDER — TETANUS-DIPHTH-ACELL PERTUSSIS 5-2.5-18.5 LF-MCG/0.5 IM SUSP: 0.5000 mL | Freq: Once | INTRAMUSCULAR | Status: DC

## 2018-10-31 MED ORDER — OXYTOCIN 40 UNITS IN NORMAL SALINE INFUSION - SIMPLE MED: 2.5000 [IU]/h | INTRAVENOUS | Status: DC

## 2018-10-31 MED ORDER — SOD CITRATE-CITRIC ACID 500-334 MG/5ML PO SOLN: 30.0000 mL | ORAL | Status: DC | PRN

## 2018-10-31 MED ORDER — SIMETHICONE 80 MG PO CHEW: 80.0000 mg | CHEWABLE_TABLET | ORAL | Status: DC | PRN

## 2018-10-31 MED ORDER — BENZOCAINE-MENTHOL 20-0.5 % EX AERO: 1.0000 "application " | INHALATION_SPRAY | CUTANEOUS | Status: DC | PRN

## 2018-10-31 MED ORDER — TERBUTALINE SULFATE 1 MG/ML IJ SOLN: 0.2500 mg | Freq: Once | INTRAMUSCULAR | Status: DC | PRN

## 2018-10-31 MED ORDER — ONDANSETRON HCL 4 MG/2ML IJ SOLN
4.0000 mg | Freq: Once | INTRAMUSCULAR | Status: AC
  Administered 2018-10-31: 4 mg via INTRAVENOUS
  Filled 2018-10-31: qty 2

## 2018-10-31 MED ORDER — PHENYLEPHRINE 40 MCG/ML (10ML) SYRINGE FOR IV PUSH (FOR BLOOD PRESSURE SUPPORT): 80.0000 ug | PREFILLED_SYRINGE | INTRAVENOUS | Status: DC | PRN

## 2018-10-31 MED ORDER — EPHEDRINE 5 MG/ML INJ: 10.0000 mg | INTRAVENOUS | Status: DC | PRN

## 2018-10-31 MED ORDER — LACTATED RINGERS IV BOLUS
1000.0000 mL | INTRAVENOUS | Status: DC | PRN
  Administered 2018-10-31: 1000 mL via INTRAVENOUS

## 2018-10-31 MED ORDER — COCONUT OIL OIL: 1.0000 "application " | TOPICAL_OIL | Status: DC | PRN

## 2018-10-31 MED ORDER — IBUPROFEN 600 MG PO TABS
600.0000 mg | ORAL_TABLET | Freq: Four times a day (QID) | ORAL | Status: DC
  Administered 2018-11-01 – 2018-11-02 (×5): 600 mg via ORAL
  Filled 2018-10-31 (×7): qty 1

## 2018-10-31 MED ORDER — OXYCODONE-ACETAMINOPHEN 5-325 MG PO TABS: 2.0000 | ORAL_TABLET | ORAL | Status: DC | PRN

## 2018-10-31 MED ORDER — FERROUS SULFATE 325 (65 FE) MG PO TABS
325.0000 mg | ORAL_TABLET | Freq: Two times a day (BID) | ORAL | Status: DC
  Administered 2018-11-01 – 2018-11-02 (×3): 325 mg via ORAL
  Filled 2018-10-31 (×3): qty 1

## 2018-10-31 MED ORDER — DIPHENHYDRAMINE HCL 50 MG/ML IJ SOLN: 12.5000 mg | INTRAMUSCULAR | Status: DC | PRN

## 2018-10-31 MED ORDER — PRENATAL MULTIVITAMIN CH
1.0000 | ORAL_TABLET | Freq: Every day | ORAL | Status: DC
  Administered 2018-11-01: 1 via ORAL
  Filled 2018-10-31: qty 1

## 2018-10-31 MED ORDER — ONDANSETRON HCL 4 MG/2ML IJ SOLN: 4.0000 mg | INTRAMUSCULAR | Status: DC | PRN

## 2018-10-31 MED ORDER — WITCH HAZEL-GLYCERIN EX PADS: 1.0000 "application " | MEDICATED_PAD | CUTANEOUS | Status: DC | PRN

## 2018-10-31 MED ORDER — ONDANSETRON HCL 4 MG PO TABS: 4.0000 mg | ORAL_TABLET | ORAL | Status: DC | PRN

## 2018-10-31 MED ORDER — MEASLES, MUMPS & RUBELLA VAC IJ SOLR: 0.5000 mL | Freq: Once | INTRAMUSCULAR | Status: DC

## 2018-10-31 MED ORDER — PROMETHAZINE HCL 25 MG/ML IJ SOLN
25.0000 mg | Freq: Once | INTRAMUSCULAR | Status: AC
  Administered 2018-10-31: 25 mg via INTRAVENOUS
  Filled 2018-10-31: qty 1

## 2018-10-31 MED ORDER — ZOLPIDEM TARTRATE 5 MG PO TABS: 5.0000 mg | ORAL_TABLET | Freq: Every evening | ORAL | Status: DC | PRN

## 2018-10-31 MED ORDER — DIPHENHYDRAMINE HCL 25 MG PO CAPS: 25.0000 mg | ORAL_CAPSULE | Freq: Four times a day (QID) | ORAL | Status: DC | PRN

## 2018-10-31 MED ORDER — OXYTOCIN 40 UNITS IN NORMAL SALINE INFUSION - SIMPLE MED
1.0000 m[IU]/min | INTRAVENOUS | Status: DC
  Administered 2018-10-31: 2 m[IU]/min via INTRAVENOUS
  Filled 2018-10-31: qty 1000

## 2018-10-31 MED ORDER — FENTANYL-BUPIVACAINE-NACL 0.5-0.125-0.9 MG/250ML-% EP SOLN
12.0000 mL/h | EPIDURAL | Status: DC | PRN
  Filled 2018-10-31: qty 250

## 2018-10-31 MED ORDER — LIDOCAINE HCL (PF) 1 % IJ SOLN
INTRAMUSCULAR | Status: DC | PRN
  Administered 2018-10-31: 5 mL via EPIDURAL

## 2018-10-31 MED ORDER — PHENYLEPHRINE 40 MCG/ML (10ML) SYRINGE FOR IV PUSH (FOR BLOOD PRESSURE SUPPORT)
80.0000 ug | PREFILLED_SYRINGE | INTRAVENOUS | Status: DC | PRN
  Filled 2018-10-31: qty 10

## 2018-10-31 MED ORDER — OXYTOCIN BOLUS FROM INFUSION
500.0000 mL | Freq: Once | INTRAVENOUS | Status: AC
  Administered 2018-10-31: 20:00:00 500 mL via INTRAVENOUS

## 2018-10-31 MED ORDER — LIDOCAINE HCL (PF) 1 % IJ SOLN: 30.0000 mL | INTRAMUSCULAR | Status: DC | PRN

## 2018-10-31 MED ORDER — PROMETHAZINE HCL 25 MG/ML IJ SOLN
25.0000 mg | Freq: Once | INTRAMUSCULAR | Status: DC
  Filled 2018-10-31: qty 1

## 2018-10-31 MED ORDER — SODIUM CHLORIDE (PF) 0.9 % IJ SOLN
INTRAMUSCULAR | Status: DC | PRN
  Administered 2018-10-31: 12 mL/h via EPIDURAL

## 2018-10-31 MED ORDER — MAGNESIUM HYDROXIDE 400 MG/5ML PO SUSP: 30.0000 mL | ORAL | Status: DC | PRN

## 2018-10-31 MED ORDER — LACTATED RINGERS IV SOLN
500.0000 mL | Freq: Once | INTRAVENOUS | Status: AC
  Administered 2018-10-31: 500 mL via INTRAVENOUS

## 2018-10-31 MED ORDER — ONDANSETRON HCL 4 MG/2ML IJ SOLN: 4.0000 mg | Freq: Four times a day (QID) | INTRAMUSCULAR | Status: DC | PRN

## 2018-10-31 MED ORDER — LACTATED RINGERS IV SOLN
INTRAVENOUS | Status: DC
  Administered 2018-10-31 (×2): via INTRAVENOUS

## 2018-10-31 MED ORDER — HYDROMORPHONE HCL 1 MG/ML IJ SOLN
1.0000 mg | INTRAMUSCULAR | Status: DC | PRN
  Administered 2018-10-31: 09:00:00 1 mg via INTRAVENOUS
  Filled 2018-10-31: qty 1

## 2018-10-31 MED ORDER — DIBUCAINE (PERIANAL) 1 % EX OINT: 1.0000 "application " | TOPICAL_OINTMENT | CUTANEOUS | Status: DC | PRN

## 2018-10-31 MED ORDER — LACTATED RINGERS IV SOLN: 500.0000 mL | INTRAVENOUS | Status: DC | PRN

## 2018-10-31 MED ORDER — HYDROMORPHONE HCL 1 MG/ML IJ SOLN: 1.0000 mg | INTRAMUSCULAR | Status: DC | PRN

## 2018-10-31 NOTE — MAU Provider Note (Signed)
History     CSN: [id number]  Arrival date and time: 10/31/18 0704   First Provider Initiated Contact with Patient 10/31/18 0813      Chief Complaint  Patient presents with  . Abdominal Pain  . Back Pain  . Rupture of Membranes  . Emesis   HPI  Ms.  Virginia Bennett is a 23 y.o. year old G1P0 female at 1863w0d weeks gestation who presents to MAU reporting she started having RT flank pain and has been vomiting 2 hours prior to arrival to MAU. She was found by RN staff half dressed laying on the floor in the MAU lobby BR vomiting and writhing in pain. She was unable to get comfortable, lay on bed, keep clothes on. She was reporting that she was so hot that she could not keep anything on her body. At the time of MAU provider assessment, she was naked laying prone hanging off the end of the bed vomiting on the floor. She could not provide much more information than what the RN had supplied.   Patient was able to provide more information to provider @ 1245: She reports "being at a celebration and eating a brownie laced with MJ." She states she was only made aware of the MJ in the brownie when she inquired about "feeling funny." She reports that when she is vomiting like she was earlier that she "feels a little better when she gets in a warm shower."  Past Medical History:  Diagnosis Date  . ADHD (attention deficit hyperactivity disorder)   . GERD (gastroesophageal reflux disease)   . Learning disability    history of IEP beginning in 5th grade  . MDD (major depressive disorder), recurrent episode (H[facility]) 03/27/2017  . Right eye injury 06/2018   laceration  . Vitamin D deficiency disease     Past Surgical History:  Procedure Laterality Date  . FOOT SURGERY Bilateral 2017   Reports had a bond taken out of both feet to correct fallen arches    Family History  Problem Relation Age of Onset  . Diabetes Father   . Asthma Sister   . Asthma Sister   . Schizophrenia Brother   . Anxiety  disorder Cousin   . Depression Cousin   . Schizophrenia Cousin     Social History   Tobacco Use  . Smoking status: Never Smoker  . Smokeless tobacco: Never Used  Substance Use Topics  . Alcohol use: No    Alcohol/week: 0.0 standard drinks  . Drug use: Not Currently    Frequency: 21.0 times per week    Types: Marijuana    Comment: last used 06/2018    Allergies:  Allergies  Allergen Reactions  . Aspirin Anaphylaxis    Father & Siblings are allergic. So Mom doesn't even give to patient.    Medications Prior to Admission  Medication Sig Dispense Refill Last Dose  . docusate sodium (COLACE) 100 MG capsule Take 1 capsule (100 mg total) by mouth 2 (two) times daily as needed. 30 capsule 2   . Doxylamine-Pyridoxine (DICLEGIS) 10-10 MG TBEC Take 2 tabs at bedtime. If needed, add another tab in the morning. If needed, add another tab in the afternoon, up to 4 tabs/day. 100 tablet 5   . fluconazole (DIFLUCAN) 150 MG tablet Take 1 tablet (150 mg total) by mouth daily. 1 tablet 0   . metroNIDAZOLE (METROGEL) 0.75 % vaginal gel Place 1 Applicatorful vaginally at bedtime. Apply one applicatorful to vagina at bedtime for 5  days 70 g 0   . Prenatal Vit-Fe Phos-FA-Omega (VITAFOL GUMMIES) 3.33-0.333-34.8 MG CHEW Chew 3 tablets by mouth daily. 90 tablet 3     Review of Systems  Constitutional: Positive for diaphoresis.  Eyes: Negative.   Respiratory: Negative.   Cardiovascular: Negative.   Gastrointestinal: Positive for nausea and vomiting.  Endocrine: Negative.   Genitourinary: Positive for flank pain (RT).  Skin: Negative.   Allergic/Immunologic: Negative.   Neurological: Negative.   Hematological: Negative.   Psychiatric/Behavioral: Positive for agitation. The patient is nervous/anxious.    Physical Exam   Blood pressure 121/71, pulse 62, temperature 97.8 F (36.6 C), temperature source Oral, resp. rate 20, last menstrual period 02/07/2018.  Physical Exam  Nursing note and  vitals reviewed. Constitutional: She is oriented to person, place, and time. She appears well-developed and well-nourished.  HENT:  Head: Normocephalic and atraumatic.  Eyes: Pupils are equal, round, and reactive to light.  Neck: Normal range of motion.  Cardiovascular: Normal rate, regular rhythm and normal heart sounds.  Respiratory: Effort normal and breath sounds normal.  GI: Soft. Bowel sounds are normal.  Genitourinary:    Genitourinary Comments: SSE: small amount of pink tinged fluid pooling in vaginal vault and large mucous plug coming out of cervical os Dilation: "tight" 2 cm Effacement (%): 70 Cervical Position: Posterior Station: 0 Presentation: Vertex Exam by: Sunday Corn, CNM   Musculoskeletal: Normal range of motion.  Neurological: She is alert and oriented to person, place, and time. She has normal reflexes.  Skin: Skin is warm and dry.  Psychiatric: She has a normal mood and affect. Her behavior is normal. Judgment and thought content normal.   VE from earlier in visit: FT/50%/-1/vtx MAU Course  Procedures  MDM CCUA UDS Renal U/S Amylase Lipase Dilaudid 1 mg IVP -- relieved pain Phenergan 25 mg IVP -- declined, requested Zofran Zofran 4 mg IVP -- minimally improved N/V >>pt requested water and vomited immediately after having water Maryann Alar Amnisure  Results for orders placed or performed during the hospital encounter of 10/31/18 (from the past 24 hour(s))  Urinalysis, Routine w reflex microscopic     Status: Abnormal   Collection Time: 10/31/18  7:54 AM  Result Value Ref Range   Color, Urine YELLOW YELLOW   APPearance CLEAR CLEAR   Specific Gravity, Urine 1.017 1.005 - 1.030   pH 7.0 5.0 - 8.0   Glucose, UA NEGATIVE NEGATIVE mg/dL   Hgb urine dipstick SMALL (A) NEGATIVE   Bilirubin Urine NEGATIVE NEGATIVE   Ketones, ur NEGATIVE NEGATIVE mg/dL   Protein, ur 30 (A) NEGATIVE mg/dL   Nitrite NEGATIVE NEGATIVE   Leukocytes,Ua NEGATIVE NEGATIVE   RBC / HPF  6-10 0 - 5 RBC/hpf   WBC, UA 0-5 0 - 5 WBC/hpf   Bacteria, UA NONE SEEN NONE SEEN   Squamous Epithelial / LPF 0-5 0 - 5   Mucus PRESENT   Urine rapid drug screen (hosp performed)     Status: Abnormal   Collection Time: 10/31/18  7:54 AM  Result Value Ref Range   Opiates NONE DETECTED NONE DETECTED   Cocaine NONE DETECTED NONE DETECTED   Benzodiazepines NONE DETECTED NONE DETECTED   Amphetamines NONE DETECTED NONE DETECTED   Tetrahydrocannabinol POSITIVE (A) NONE DETECTED   Barbiturates NONE DETECTED NONE DETECTED  CBC with Differential/Platelet     Status: Abnormal   Collection Time: 10/31/18  8:46 AM  Result Value Ref Range   WBC 12.3 (H) 4.0 - 10.5 K/uL   RBC  4.56 3.87 - 5.11 MIL/uL   Hemoglobin 13.1 12.0 - 15.0 g/dL   HCT 03.5 00.9 - 38.1 %   MCV 86.6 80.0 - 100.0 fL   MCH 28.7 26.0 - 34.0 pg   MCHC 33.2 30.0 - 36.0 g/dL   RDW 82.9 93.7 - 16.9 %   Platelets 302 150 - 400 K/uL   nRBC 0.0 0.0 - 0.2 %   Neutrophils Relative % 73 %   Neutro Abs 8.9 (H) 1.7 - 7.7 K/uL   Lymphocytes Relative 16 %   Lymphs Abs 2.0 0.7 - 4.0 K/uL   Monocytes Relative 10 %   Monocytes Absolute 1.2 (H) 0.1 - 1.0 K/uL   Eosinophils Relative 0 %   Eosinophils Absolute 0.1 0.0 - 0.5 K/uL   Basophils Relative 0 %   Basophils Absolute 0.0 0.0 - 0.1 K/uL   Immature Granulocytes 1 %   Abs Immature Granulocytes 0.14 (H) 0.00 - 0.07 K/uL  Basic metabolic panel     Status: Abnormal   Collection Time: 10/31/18  8:46 AM  Result Value Ref Range   Sodium 140 135 - 145 mmol/L   Potassium 3.8 3.5 - 5.1 mmol/L   Chloride 109 98 - 111 mmol/L   CO2 18 (L) 22 - 32 mmol/L   Glucose, Bld 87 70 - 99 mg/dL   BUN 7 6 - 20 mg/dL   Creatinine, Ser 6.78 0.44 - 1.00 mg/dL   Calcium 9.0 8.9 - 93.8 mg/dL   GFR calc non Af Amer >60 >60 mL/min   GFR calc Af Amer >60 >60 mL/min   Anion gap 13 5 - 15  Amylase     Status: Abnormal   Collection Time: 10/31/18  8:46 AM  Result Value Ref Range   Amylase 128 (H) 28 - 100  U/L  Lipase, blood     Status: None   Collection Time: 10/31/18  8:46 AM  Result Value Ref Range   Lipase 44 11 - 51 U/L  Amnisure rupture of membrane (rom)not at [facility]     Status: None   Collection Time: 10/31/18 12:40 PM  Result Value Ref Range   Amnisure ROM POSITIVE      US Renal  Result Date: 10/31/2018 CLINICAL DATA:  Right lower abdominal pain/back pain and emesis. [redacted] weeks pregnant. EXAM: RENAL / URINARY TRACT ULTRASOUND COMPLETE COMPARISON:  None. FINDINGS: Right Kidney: Renal measurements: 11.7 x 6 x 5.4 cm = volume: 190 mL . Echogenicity within normal limits. No mass or echogenic focus identified. Mild hydronephrosis. Left Kidney: Renal measurements: 11.2 x 6.3 x 5.5 cm = volume: 194 mL. Echogenicity within normal limits. No mass, echogenic focus, or hydronephrosis visualized. Bladder: Suboptimal evaluation of the bladder due to underdistention. IMPRESSION: 1. Mild right hydronephrosis. No sonographic evidence of mass or renal calculus. 2.  Normal appearance of the left kidney. Electronically Signed   By: Emmaline Kluver M.D.   On: 10/31/2018 11:51    Assessment and Plan  1) Leakage, amniotic fluid 2) Indication for care in labor or delivery - Admit to L&D - Routine orders  - Patient notified that she would be admitted to L&D d/t SROM - Discussed labor process - Admission called to Dr. Elita Quick - Dr.Wallace to assume care of patient at time of admission   Raelyn Mora, MSN, CNM 10/31/2018, 12:17 PM

## 2018-10-31 NOTE — H&P (Signed)
LABOR AND DELIVERY ADMISSION HISTORY AND PHYSICAL NOTE  Virginia Bennett is a 23 y.o. female G1P0 with IUP at 3250w0d by LMP presenting for SROM. SROM occurred while in MAU, unsure of the exact time. +Amnisure.  Currently experiencing a lot of nausea/vomiting that seems to be related to [facility].   She reports positive fetal movement. She denies leakage of fluid or vaginal bleeding.  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 ADHD (attention deficit hyperactivity disorder)   . GERD (gastroesophageal reflux disease)   . Learning disability    history of IEP beginning in 5th grade  . MDD (major depressive disorder), recurrent episode ([facility]) 03/27/2017  . Right eye injury 06/2018   laceration  . Vitamin D deficiency disease     Past Surgical History: Past Surgical History:  Procedure Laterality Date  . FOOT SURGERY Bilateral 2017   Reports had a bond taken out of both feet to correct fallen arches    Obstetrical History: OB History    Gravida  1   Para  0   Term      Preterm      AB      Living        SAB      TAB      Ectopic      Multiple      Live Births              Social History: Social History   Socioeconomic History  . Marital status: Single    Spouse name: Not on file  . Number of children: Not on file  . Years of education: Not on file  . Highest education level: Not on file  Occupational History  . Not on file  Social Needs  . Financial resource strain: Not hard at all  . Food insecurity    Worry: Never true    Inability: Never true  . Transportation needs    Medical: No    Non-medical: No  Tobacco Use  . Smoking status: Never Smoker  . Smokeless tobacco: Never Used  Substance and Sexual Activity  . Alcohol use: No    Alcohol/week:  0.0 standard drinks  . Drug use: Not Currently    Frequency: 21.0 times per week    Types: Marijuana    Comment: last used 06/2018  . Sexual activity: Yes    Partners: Male    Birth control/protection: None  Lifestyle  . Physical activity    Days per week: 0 days    Minutes per session: Not on file  . Stress: Very much  Relationships  . Social Musicianconnections    Talks on phone: Twice a week    Gets together: Never    Attends religious service: Never    Active member of club or organization: No    Attends meetings of clubs or organizations: Never    Relationship status: Never married  Other Topics Concern  . Not on file  Social History Narrative   Lives at home mom, mom's spouse 3 siblings. No pets. No smokers. Mom works days as Public house managerLPN. Stepfather is at home. [place] in [place] summer.    Family History: Family History  Problem Relation Age of Onset  . Diabetes Father   . Asthma Sister   .  Asthma Sister   . Schizophrenia Brother   . Anxiety disorder Cousin   . Depression Cousin   . Schizophrenia Cousin     Allergies: Allergies  Allergen Reactions  . Aspirin Anaphylaxis    Father & Siblings are allergic. So Mom doesn't even give to patient.    Medications Prior to Admission  Medication Sig Dispense Refill Last Dose  . docusate sodium (COLACE) 100 MG capsule Take 1 capsule (100 mg total) by mouth 2 (two) times daily as needed. 30 capsule 2   . Doxylamine-Pyridoxine (DICLEGIS) 10-10 MG TBEC Take 2 tabs at bedtime. If needed, add another tab in the morning. If needed, add another tab in the afternoon, up to 4 tabs/day. 100 tablet 5   . fluconazole (DIFLUCAN) 150 MG tablet Take 1 tablet (150 mg total) by mouth daily. 1 tablet 0   . metroNIDAZOLE (METROGEL) 0.75 % vaginal gel Place 1 Applicatorful vaginally at bedtime. Apply one applicatorful to vagina at bedtime for 5 days 70 g 0   . Prenatal Vit-Fe Phos-FA-Omega (VITAFOL GUMMIES) 3.33-0.333-34.8 MG CHEW Chew 3  tablets by mouth daily. 90 tablet 3      Review of Systems  All systems reviewed and negative except as stated in HPI  Physical Exam Blood pressure 121/71, pulse 62, temperature 97.8 F (36.6 C), temperature source Oral, resp. rate 20, last menstrual period 02/07/2018. General appearance: alert, oriented, nauseous, difficult to illicit clear history  Lungs: normal respiratory effort Heart: regular rate Abdomen: soft, non-tender; gravid, FH appropriate for GA Extremities: No calf swelling or tenderness Presentation: cephalic Fetal monitoring: 135 bpm, moderate variability, +acels, no decels  Uterine activity: None  Dilation: 2 Effacement (%): 70 Station: 0 Exam by:: Sunday Corn, CNM  Prenatal labs: ABO, Rh: A/Positive/-- (04/09 1601) Antibody: Negative (04/09 1601) Rubella: 1.68 (04/09 1601) RPR: Non Reactive (07/30 0859)  HBsAg: Negative (04/09 1601)  HIV: Non Reactive (07/30 0859)  GC/Chlamydia: Negative  GBS: --[place]enderson Cloud (09/28 1120)  2-hr GTT: Normal  Genetic screening:  Negative Anatomy US: Normal   Prenatal Transfer Tool  Maternal Diabetes: No Genetic Screening: Normal Maternal Ultrasounds/Referrals: Normal Fetal Ultrasounds or other Referrals:  None Maternal Substance Abuse:  Yes:  Type: Marijuana Significant Maternal Medications:  None Significant Maternal Lab Results: Group B Strep negative  Results for orders placed or performed during the hospital encounter of 10/31/18 (from the past 24 hour(s))  Urinalysis, Routine w reflex microscopic   Collection Time: 10/31/18  7:54 AM  Result Value Ref Range   Color, Urine YELLOW YELLOW   APPearance CLEAR CLEAR   Specific Gravity, Urine 1.017 1.005 - 1.030   pH 7.0 5.0 - 8.0   Glucose, UA NEGATIVE NEGATIVE mg/dL   Hgb urine dipstick SMALL (A) NEGATIVE   Bilirubin Urine NEGATIVE NEGATIVE   Ketones, ur NEGATIVE NEGATIVE mg/dL   Protein, ur 30 (A) NEGATIVE mg/dL   Nitrite NEGATIVE NEGATIVE   Leukocytes,Ua NEGATIVE  NEGATIVE   RBC / HPF 6-10 0 - 5 RBC/hpf   WBC, UA 0-5 0 - 5 WBC/hpf   Bacteria, UA NONE SEEN NONE SEEN   Squamous Epithelial / LPF 0-5 0 - 5   Mucus PRESENT   Urine rapid drug screen (hosp performed)   Collection Time: 10/31/18  7:54 AM  Result Value Ref Range   Opiates NONE DETECTED NONE DETECTED   Cocaine NONE DETECTED NONE DETECTED   Benzodiazepines NONE DETECTED NONE DETECTED   Amphetamines NONE DETECTED NONE DETECTED   Tetrahydrocannabinol POSITIVE (A) NONE DETECTED  Barbiturates NONE DETECTED NONE DETECTED  CBC with Differential/Platelet   Collection Time: 10/31/18  8:46 AM  Result Value Ref Range   WBC 12.3 (H) 4.0 - 10.5 K/uL   RBC 4.56 3.87 - 5.11 MIL/uL   Hemoglobin 13.1 12.0 - 15.0 g/dL   HCT 22.0 25.4 - 27.0 %   MCV 86.6 80.0 - 100.0 fL   MCH 28.7 26.0 - 34.0 pg   MCHC 33.2 30.0 - 36.0 g/dL   RDW 62.3 76.2 - 83.1 %   Platelets 302 150 - 400 K/uL   nRBC 0.0 0.0 - 0.2 %   Neutrophils Relative % 73 %   Neutro Abs 8.9 (H) 1.7 - 7.7 K/uL   Lymphocytes Relative 16 %   Lymphs Abs 2.0 0.7 - 4.0 K/uL   Monocytes Relative 10 %   Monocytes Absolute 1.2 (H) 0.1 - 1.0 K/uL   Eosinophils Relative 0 %   Eosinophils Absolute 0.1 0.0 - 0.5 K/uL   Basophils Relative 0 %   Basophils Absolute 0.0 0.0 - 0.1 K/uL   Immature Granulocytes 1 %   Abs Immature Granulocytes 0.14 (H) 0.00 - 0.07 K/uL  Basic metabolic panel   Collection Time: 10/31/18  8:46 AM  Result Value Ref Range   Sodium 140 135 - 145 mmol/L   Potassium 3.8 3.5 - 5.1 mmol/L   Chloride 109 98 - 111 mmol/L   CO2 18 (L) 22 - 32 mmol/L   Glucose, Bld 87 70 - 99 mg/dL   BUN 7 6 - 20 mg/dL   Creatinine, Ser 5.17 0.44 - 1.00 mg/dL   Calcium 9.0 8.9 - 61.6 mg/dL   GFR calc non Af Amer >60 >60 mL/min   GFR calc Af Amer >60 >60 mL/min   Anion gap 13 5 - 15  Amylase   Collection Time: 10/31/18  8:46 AM  Result Value Ref Range   Amylase 128 (H) 28 - 100 U/L  Lipase, blood   Collection Time: 10/31/18  8:46 AM   Result Value Ref Range   Lipase 44 11 - 51 U/L  Amnisure rupture of membrane (rom)not at [facility]   Collection Time: 10/31/18 12:40 PM  Result Value Ref Range   Amnisure ROM POSITIVE     Patient Active Problem List   Diagnosis Date Noted  . Leakage, amniotic fluid 10/31/2018  . Full-thickness laceration of eyelid, not involving lacrimal drainage structures, right, initial encounter 07/07/2018  . Supervision of normal first pregnancy 04/29/2018  . MDD (major depressive disorder), single episode with atypical features 05/16/2017  . Adjustment disorder, unspecified 03/19/2017  . MDD (major depressive disorder), single episode, severe with psychosis (HCC) 03/18/2017  . Surveillance of implantable subdermal contraceptive 04/07/2014  . History of ADHD 04/07/2014  . BMI (body mass index), pediatric, greater than or equal to 95% for age 79/10/2014  . Acne vulgaris 03/01/2014  . Slow transit constipation 03/01/2014    Assessment: Virginia Bennett is a 24 y.o. G1P0 at [redacted]w[redacted]d here for PROM.   #Labor: Patient has been ruptured for at least more than 4 hours without any contractions or cervical change. Patient agreeable to augmentation. Will start Pitocin 2x2 as cervix is >50% effaced. Considered FB but patient unable to tolerate lying on back due to nausea/vomiting.  #Pain: Plans for epidural  #FWB: Cat I  #ID:  [id number]  #MOF: Bottle  #MOC:Requests contraception. Unsure of type. Will discuss PP.  #Circ:  ?  De Hollingshead 10/31/2018, 1:53 PM

## 2018-10-31 NOTE — Discharge Summary (Addendum)
OB Discharge Summary     Patient Name: Virginia Bennett DOB: 1996/01/01 MRN: [id number]  Date of admission: 10/31/2018 Delivering MD: Arvilla Market   Date of discharge: 11/02/2018  Admitting diagnosis: 38 weeks, back pain CTX  Intrauterine pregnancy: [redacted]w[redacted]d     Secondary diagnosis:  Active Problems:   MDD (major depressive disorder), single episode with atypical features   Leakage, amniotic fluid   Premature rupture of membranes   SVD (spontaneous vaginal delivery)   Tetrahydrocannabinol (THC) use disorder, mild, abuse   History of suicide attempt  Additional problems: NA     Discharge diagnosis: Term Pregnancy Delivered                                                                                                Post partum procedures:NA  Augmentation: AROM and Pitocin  Complications: None  Hospital course:  Onset of Labor With Vaginal Delivery     23 y.o. yo G1P0 at [redacted]w[redacted]d was admitted in Latent Labor on 10/31/2018. Patient had an uncomplicated labor course as follows:  Membrane Rupture Time/Date: 7:41 PM ,10/31/2018   Intrapartum Procedures: Episiotomy: None [1]                                         Lacerations:  None [1]  Patient had a delivery of a Viable infant. 10/31/2018  Information for the patient's newborn:  Brock, Mokry [[id number]]  Delivery Method: Vaginal, Spontaneous(Filed from Delivery Summary)     Pateint had an uncomplicated postpartum course.  She is ambulating, tolerating a regular diet, passing flatus, and urinating well. Patient is discharged home in stable condition on 11/02/18.   Physical exam  Vitals:   11/01/18 0918 11/01/18 1353 11/01/18 2009 11/02/18 0500  BP: 122/74 (!) 116/58 115/71 111/73  Pulse: 65 81 89 66  Resp: 18 16 14 14   Temp: 98 F (36.7 C) 99 F (37.2 C) 98.6 F (37 C) 98 F (36.7 C)  TempSrc: Oral Oral Oral Oral  SpO2: 100%     Weight:      Height:       General: alert, cooperative and no  distress Lochia: appropriate Uterine Fundus: firm Incision: N/A DVT Evaluation: No evidence of DVT seen on physical exam. Labs: Lab Results  Component Value Date   WBC 12.3 (H) 10/31/2018   HGB 13.1 10/31/2018   HCT 39.5 10/31/2018   MCV 86.6 10/31/2018   PLT 302 10/31/2018   CMP Latest Ref Rng & Units 10/31/2018  Glucose 70 - 99 mg/dL 87  BUN 6 - 20 mg/dL 7  Creatinine 12/31/2018 - 3.22 mg/dL 0.25  Sodium 4.27 - 062 mmol/L 140  Potassium 3.5 - 5.1 mmol/L 3.8  Chloride 98 - 111 mmol/L 109  CO2 22 - 32 mmol/L 18(L)  Calcium 8.9 - 10.3 mg/dL 9.0  Total Protein 6.5 - 8.1 g/dL -  Total Bilirubin 0.3 - 1.2 mg/dL -  Alkaline Phos 38 - 376 U/L -  AST 15 - 41 U/L -  ALT 0 - 44 U/L -    Discharge instruction: per After Visit Summary and "Baby and Me Booklet".  After visit meds:  Allergies as of 11/02/2018      Reactions   Aspirin Anaphylaxis   Father & Siblings are allergic. So Mom doesn't even give to patient.      Medication List    STOP taking these medications   docusate sodium 100 MG capsule Commonly known as: COLACE   Doxylamine-Pyridoxine 10-10 MG Tbec Commonly known as: Diclegis   fluconazole 150 MG tablet Commonly known as: Diflucan   metroNIDAZOLE 0.75 % vaginal gel Commonly known as: METROGEL     TAKE these medications   acetaminophen 325 MG tablet Commonly known as: Tylenol Take 2 tablets (650 mg total) by mouth every 4 (four) hours as needed (for pain scale < 4).   Vitafol Gummies 3.33-0.333-34.8 MG Chew Chew 3 tablets by mouth daily.      Diet: routine diet  Activity: Advance as tolerated. Pelvic rest for 6 weeks.   Outpatient follow up:4 weeks Follow up Appt: Future Appointments  Date Time Provider [place]  12/02/2018 10:00 AM Lajean Manes, CNM [facility] None   Follow up Visit:No follow-ups on file.   Please schedule this patient for Postpartum visit in: 4 weeks with the following provider: Any provider For C/S patients  schedule nurse incision check in weeks 2 weeks: no Low risk pregnancy complicated by: THC use  Delivery mode:  SVD Anticipated Birth Control:  other/unsure PP Procedures needed: None   Schedule Integrated BH visit: no   Postpartum contraception: Nexplanon  Newborn Data: Live born female  Birth Weight:  2455 APGAR: 3, 10  Newborn Delivery   Birth date/time: 10/31/2018 19:42:00 Delivery type: Vaginal, Spontaneous      Baby Feeding: Bottle Disposition:home with mother  11/02/2018 Gifford Shave, MD  DISCHARGE ATTESTATION  I have seen and examined this patient and agree with above documentation in the resident's note. Patient advised to have 2 week mood check with [facility] given problem list and patient mention of previous suicide attempt. Message sent to clinic by me.  Darlina Rumpf, [facility] 11/02/2018

## 2018-10-31 NOTE — Anesthesia Preprocedure Evaluation (Signed)
Anesthesia Evaluation  Patient identified by MRN, date of birth, ID band Patient awake    Reviewed: Allergy & Precautions, NPO status , Patient's Chart, lab work & pertinent test results  Airway Mallampati: II  TM Distance: >3 FB Neck ROM: Full    Dental no notable dental hx. (+) Teeth Intact   Pulmonary neg pulmonary ROS,    Pulmonary exam normal breath sounds clear to auscultation       Cardiovascular Exercise Tolerance: Good negative cardio ROS Normal cardiovascular exam Rhythm:Regular Rate:Normal     Neuro/Psych PSYCHIATRIC DISORDERS Depression negative neurological ROS     GI/Hepatic Neg liver ROS, GERD  ,  Endo/Other  negative endocrine ROS  Renal/GU negative Renal ROS     Musculoskeletal   Abdominal   Peds  Hematology negative hematology ROS (+) hgb 13.1 Plt 302   Anesthesia Other Findings   Reproductive/Obstetrics (+) Pregnancy                             Anesthesia Physical Anesthesia Plan  ASA: II  Anesthesia Plan: Epidural   Post-op Pain Management:    Induction:   PONV Risk Score and Plan:   Airway Management Planned:   Additional Equipment:   Intra-op Plan:   Post-operative Plan:   Informed Consent: I have reviewed the patients History and Physical, chart, labs and discussed the procedure including the risks, benefits and alternatives for the proposed anesthesia with the patient or authorized representative who has indicated his/her understanding and acceptance.       Plan Discussed with:   Anesthesia Plan Comments: (38 wk G1P0 for LEA)        Anesthesia Quick Evaluation

## 2018-10-31 NOTE — Anesthesia Procedure Notes (Addendum)
Epidural Patient location during procedure: OB Start time: 10/31/2018 6:50 PM End time: 10/31/2018 7:01 PM  Staffing Anesthesiologist: Barnet Glasgow, MD Performed: anesthesiologist   Preanesthetic Checklist Completed: patient identified, site marked, surgical consent, pre-op evaluation, timeout performed, IV checked, risks and benefits discussed and monitors and equipment checked  Epidural Patient position: sitting Prep: site prepped and draped and DuraPrep Patient monitoring: continuous pulse ox and blood pressure Approach: midline Location: L3-L4 Injection technique: LOR air  Needle:  Needle type: Tuohy  Needle gauge: 17 G Needle length: 9 cm and 9 Needle insertion depth: 8 cm Catheter type: closed end flexible Catheter size: 19 Gauge Catheter at skin depth: 14 cm Test dose: negative  Assessment Events: blood not aspirated, injection not painful, no injection resistance, negative IV test and no paresthesia  Additional Notes Patient identified. Risks/Benefits/Options discussed with patient including but not limited to bleeding, infection, nerve damage, paralysis, failed block, incomplete pain control, headache, blood pressure changes, nausea, vomiting, reactions to medication both or allergic, itching and postpartum back pain. Confirmed with bedside nurse the patient's most recent platelet count. Confirmed with patient that they are not currently taking any anticoagulation, have any bleeding history or any family history of bleeding disorders. Patient expressed understanding and wished to proceed. All questions were answered. Sterile technique was used throughout the entire procedure. Please see nursing notes for vital signs. Test dose was given through epidural needle and negative prior to continuing to dose epidural or start infusion. Warning signs of high block given to the patient including shortness of breath, tingling/numbness in hands, complete motor block, or any  concerning symptoms with instructions to call for help. Patient was given instructions on fall risk and not to get out of bed. All questions and concerns addressed with instructions to call with any issues. 1 Attempt (S) . Patient tolerated procedure well.

## 2018-10-31 NOTE — MAU Note (Signed)
Pt's mother called the unit and would like an update on pt status. Informed mom that I cannot give out any information regarding patient on the phone but that I will have the patient call her and give an update, mother verbalizes understanding and is appreciative. Informed the patient that her mother would like an update and would like for her to call, pt's cellphone given to pt and pt states she will call mother.

## 2018-10-31 NOTE — MAU Note (Signed)
Pt reports to mau with c/o lower abd and back pain since yesterday.  Pt also reports several episodes of emesis over the last 2 hours.  Pt reports small amt of leaking fluid.  Denies vag bleeding.  Reports good fetal movement.

## 2018-10-31 NOTE — MAU Note (Signed)
Pt refusing to stay on fetal heart monitor, due to continuous pain and vomiting, cnm made aware.

## 2018-11-01 ENCOUNTER — Telehealth: Payer: Medicaid Other | Admitting: Obstetrics

## 2018-11-01 DIAGNOSIS — Z30017 Encounter for initial prescription of implantable subdermal contraceptive: Secondary | ICD-10-CM

## 2018-11-01 LAB — RPR: RPR Ser Ql: NONREACTIVE

## 2018-11-01 MED ORDER — ETONOGESTREL 68 MG ~~LOC~~ IMPL
68.0000 mg | DRUG_IMPLANT | Freq: Once | SUBCUTANEOUS | Status: AC
  Administered 2018-11-01: 68 mg via SUBCUTANEOUS
  Filled 2018-11-01: qty 1

## 2018-11-01 MED ORDER — LIDOCAINE HCL 1 % IJ SOLN
0.0000 mL | Freq: Once | INTRAMUSCULAR | Status: AC | PRN
  Administered 2018-11-01: 3 mL via INTRADERMAL
  Filled 2018-11-01: qty 20

## 2018-11-01 NOTE — Anesthesia Postprocedure Evaluation (Signed)
Anesthesia Post Note  Patient: Theme park manager  Procedure(s) Performed: AN AD HOC LABOR EPIDURAL     Patient location during evaluation: Mother Baby Anesthesia Type: Epidural Level of consciousness: awake and alert Pain management: pain level controlled Vital Signs Assessment: post-procedure vital signs reviewed and stable Respiratory status: spontaneous breathing, nonlabored ventilation and respiratory function stable Cardiovascular status: stable Postop Assessment: no headache, no backache, epidural receding, no apparent nausea or vomiting, patient able to bend at knees, adequate PO intake and able to ambulate Anesthetic complications: no    Last Vitals:  Vitals:   11/01/18 0230 11/01/18 0550  BP: 127/60 (!) 121/92  Pulse: 63 62  Resp: 18 18  Temp: 36.9 C 36.6 C  SpO2: 100% 99%    Last Pain:  Vitals:   11/01/18 0803  TempSrc:   PainSc: 0-No pain   Pain Goal: Patients Stated Pain Goal: 0 (10/31/18 0904)              Epidural/Spinal Function Cutaneous sensation: Normal sensation (11/01/18 0803)  Jabier Mutton

## 2018-11-01 NOTE — Procedures (Signed)
GYNECOLOGY PROCEDURE NOTE  Virginia Bennett is a 23 y.o. G1P1001 requesting Nexplanon insertion. No gynecologic concerns.  Nexplanon Insertion Procedure Patient identified, informed consent performed, consent signed. Patient does understand that irregular bleeding is a very common side effect of this medication. She was advised to have backup contraception for one week after placement. Appropriate time out taken. Patient's left arm was prepped and draped in the usual sterile fashion. The insertion area was measured and marked. Patient was prepped with alcohol swab and then injected with 3 ml of 1% lidocaine. The area was then prepped with betadine. Nexplanon removed from packaging and device confirmed present within needle, then inserted full length of needle and withdrawn per handbook instructions. Nexplanon was able to palpated in the patient's arm; patient palpated the insert herself. There was minimal blood loss. Patient insertion site covered with steri strips, guaze, and a pressure bandage to reduce any bruising. The patient tolerated the procedure well and was given post procedure instructions.

## 2018-11-01 NOTE — Progress Notes (Signed)
POSTPARTUM PROGRESS NOTE  Post Partum Day 1  Subjective:  Virginia Bennett is a 23 y.o. G1P1001 s/p SVD at [redacted]w[redacted]d.  She reports she is doing well. No acute events overnight. She denies any problems with ambulating, voiding or po intake. Denies nausea or vomiting.  Pain is well controlled.  Lochia is appropriate.  Patient denies headache, change in vision, shortness of breath, chest pain, issues urinating.  Objective: Blood pressure (!) 121/92, pulse 62, temperature 97.8 F (36.6 C), temperature source Oral, resp. rate 18, height 5\' 4"  (1.626 m), weight 91 kg, last menstrual period 02/07/2018, SpO2 99 %, unknown if currently breastfeeding.  Physical Exam:  General: alert, cooperative and no distress Chest: no respiratory distress Heart:regular rate, distal pulses intact Abdomen: soft, nontender,  Uterine Fundus: firm, appropriately tender DVT Evaluation: No calf swelling or tenderness Extremities:  edema Skin: warm, dry  Recent Labs    10/31/18 0846  HGB 13.1  HCT 39.5    Assessment/Plan: Virginia Bennett is a 23 y.o. G1P1001 s/p SVD at 104w0d   PPD#1 - Doing well  Routine postpartum care  Contraception: Nexplanon Feeding: bottle Dispo: Plan for discharge tomorrow.   LOS: 1 day   Gifford Shave, MD  PGY-1, [facility]  11/01/2018, 9:08 AM
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
## 2018-11-02 ENCOUNTER — Ambulatory Visit: Payer: Self-pay

## 2018-11-02 DIAGNOSIS — Z915 Personal history of self-harm: Secondary | ICD-10-CM

## 2018-11-02 DIAGNOSIS — Z9151 Personal history of suicidal behavior: Secondary | ICD-10-CM

## 2018-11-02 MED ORDER — ACETAMINOPHEN 325 MG PO TABS: 650.0000 mg | ORAL_TABLET | ORAL | Status: DC | PRN

## 2018-11-02 NOTE — Lactation Note (Addendum)
This note was copied from a baby's chart. Lactation Consultation Note  Patient Name: Virginia Bennett [id number] Date: 11/02/2018  Late note from the 1130 am feeding with the speech pathologist.  As LC entered the room per D. McCloud baby had fed at the breast and  She was in the middle of teaching mom how to PACE feed the baby with the  Gold nipple. ( see The SP note ).  LC had assisted to change a wet and a stool diaper and noted when the baby was crying a short skin tag underneath the midsection of the tongue.  LC reported it to the [place] ( speech ) of the LC's findings.  Mom had pumped with the DEBP early with some results and the EBM was mixed with the formula being fed by the mom.       Maternal Data    Feeding Feeding Type: Breast Fed  LATCH Score                   Interventions    Lactation Tools Discussed/Used     Consult Status      [name] 11/02/2018, 6:12 PM

## 2018-11-02 NOTE — Lactation Note (Signed)
This note was copied from a baby's chart. Lactation Consultation Note  Patient Name: Virginia Bennett [id number] Date: 11/02/2018 Reason for consult: Initial assessment;1st time breastfeeding;Primapara;Early term 37-38.6wks;Infant < 6lbs;Infant weight loss;Other (Comment)(7 % weight loss)  Baby is 66 1/2  hours old.  As LC entered the room dad was placing baby in the crib and the baby started spitting a small amount of undigested formula LC used the bulb syringe x 1.  LC checked the diaper - large wet and large black to mec stool and pasty changed by LC.  LC settled the baby in the crib safely.  [place] set mom up with the DEBP and explained the set up and cleaning.  The #24 F were comfortable in appearance and per mom comfortable.  Mom still pumping and LC will check back.  Per mom has been shown how to hand express , LC encouraged before pumping and after or just after.  Per mom active with [facility]. LC offered to send a referral for a DEBP after D/C due to baby being less than 6 pounds, early term, and sluggishly eating.  Mom receptive to the referral and LC also recommended for her to call today to  Inform [facility] she needs a DEBP after D/C.  LC reviewed the benefits of breastfeeding and providing EBM for the baby, reasons for extra post pumping.  Mom receptive and dad noted to be supportive.     Maternal Data Has patient been taught Hand Expression?: Yes  Feeding Feeding Type: Formula Nipple Type: Slow - flow  LATCH Score ( Latch Score by the [facility] )  Latch: Grasps breast easily, tongue down, lips flanged, rhythmical sucking.  Audible Swallowing: A few with stimulation  Type of Nipple: Everted at rest and after stimulation  Comfort (Breast/Nipple): Soft / non-tender  Hold (Positioning): Assistance needed to correctly position infant at breast and maintain latch.  LATCH Score: 8  Interventions Interventions: Breast feeding basics reviewed  Lactation Tools  Discussed/Used Tools: Pump Breast pump type: Double-Electric Breast Pump WIC Program: Yes Pump Review: Setup, frequency, and cleaning Initiated by:: MAI Date initiated:: 11/02/18   Consult Status Consult Status: Follow-up Date: 11/02/18 Follow-up type: In-patient    [place] 11/02/2018, 10:24 AM

## 2018-11-02 NOTE — Discharge Instructions (Signed)

## 2018-11-03 ENCOUNTER — Ambulatory Visit: Payer: Self-pay

## 2018-11-03 NOTE — Lactation Note (Signed)
This note was copied from a baby's chart. Lactation Consultation Note  Patient Name: Virginia Bennett [id number] Date: 11/03/2018 Reason for consult: Follow-up assessment;Difficult latch;Other (Comment);Early term 52-38.6wks;Infant < 6lbs;1st time breastfeeding;Primapara;Infant weight loss(difficult latch improving/ Speech pathologist 10/13/ tongue mobiliy issues)  Baby is 64 hours  As LC entered the room, baby was resting in front of mom safely. Per mom the baby recently fed 9:39 for 20 mins with swallows.  LC mentioned to mom she would come back after the Pedis MD assessed the anterior tongue doe mobility issues.  LC reported to Dr. Michel Santee that the baby had picked up on is eating and the  Anterior tongue - tie needed to be assessed.  Will Check back with mom.    Maternal Data Has patient been taught Hand Expression?: Yes  Feeding Feeding Type: Breast Fed  LATCH Score ( Latch score by the [facility]  Latch: Grasps breast easily, tongue down, lips flanged, rhythmical sucking.  Audible Swallowing: Spontaneous and intermittent  Type of Nipple: Everted at rest and after stimulation  Comfort (Breast/Nipple): Soft / non-tender  Hold (Positioning): No assistance needed to correctly position infant at breast.  LATCH Score: 10  Interventions Interventions: Breast feeding basics reviewed  Lactation Tools Discussed/Used Tools: Pump Breast pump type: Double-Electric Breast Pump WIC Program: Yes   Consult Status Consult Status: Follow-up Date: 11/03/18 Follow-up type: In-patient    [place] 11/03/2018, 11:43 AM

## 2018-11-03 NOTE — Lactation Note (Signed)
This note was copied from a baby's chart. Lactation Consultation Note  Patient Name: Virginia Bennett [id number] Date: 11/03/2018 Reason for consult: Follow-up assessment;Primapara;1st time breastfeeding;Early term 37-38.6wks;Infant < 6lbs;Infant weight loss;Other (Comment)(10 % weight loss , post anterior frenotomy -)  2nd LC visit for this mom and baby.  As LC entered the room baby sucking on a pacifier. LC reviewed the benefits of holding off for at least for 3 weeks on the use of a pacifier and especially since the baby had an anterior frenotomy today.  LC  Discussed the importance of allowing the baby time to get use to having   mobility of his tongue. Baby rooting and LC offered to assess him latching at the breast on the right breast ./ football/ baby was slow to get organized and once the tissue was molded in his mouth with compressions he fed for 8 mins with multiple swallows, increased with compressions.  When he released the nipple was well rounded and baby was settled for about 10 mins and then was acting hungry. LC called Dacia McCloud to find out what nipple she would recommended since the baby was using a gold nipple and she was busy with another patient. Since the baby was hungry , LC tried the very slow flow nipple from [facility] and the baby was able to organize sucking pattern well and took 10 ml of EBM mom had pumped.  LC reviewed the [facility] plan and recommended due to the weight loss the baby being and Early term, less than 6 pounds 1st offer breast for 15 - 20 mins , 30  mins max STS and then after the breast feed the EBM or formula if EBM not available with the bottle as shown using the purple nipple and graduating to the yellow nipple then to the medium based Dr. Manson Passey nipple.  Post pump both breast for 15 - 20 mins and safe milk for the next  Feeding  Next feeding switch to the other breast to feed and follow the sane routine.  LC recommended for mom to check at [facility] health for  Children to see if she can schedule F/U  appt with Soyla Dryer, RN , IBCLC for a feeding assessment post anterior frenotomy.  If she is unable to schedule the appt to call the [facility] clinic for [facility] appt with Noralee Stain, RN , IBCLC.   LC reviewed sore nipple and engorgement prevention and tx . ( mom denies soreness)  And the DEBP Symphony. Dad went to pick the DEBP up at [facility] today at 1300.   Mom expressed appreciation for [facility] assist and expressed excitement the baby is now feeding so well and was able to repeat the [facility] plan back to the [facility].  LC packed all the pump pieces and extra nipples for D/C.      Maternal Data Has patient been taught Hand Expression?: Yes  Feeding Feeding Type: Breast Milk Nipple Type: Extra Slow Flow  LATCH Score Latch: Grasps breast easily, tongue down, lips flanged, rhythmical sucking.  Audible Swallowing: Spontaneous and intermittent  Type of Nipple: Everted at rest and after stimulation  Comfort (Breast/Nipple): Filling, red/small blisters or bruises, mild/mod discomfort  Hold (Positioning): Assistance needed to correctly position infant at breast and maintain latch.  LATCH Score: 8  Interventions Interventions: Breast feeding basics reviewed  Lactation Tools Discussed/Used Tools: Shells;Pump Flange Size: 24;27 Shell Type: Inverted Breast pump type: Double-Electric Breast Pump;Manual WIC Program: Yes Pump Review: Milk Storage;Setup, frequency, and cleaning Initiated  by:: MAI   Consult Status Consult Status: Follow-up Date: 11/03/18 Follow-up type: [place] 11/03/2018, 3:32 PM

## 2018-11-15 ENCOUNTER — Telehealth: Payer: Medicaid Other | Admitting: Advanced Practice Midwife

## 2018-12-02 ENCOUNTER — Ambulatory Visit (INDEPENDENT_AMBULATORY_CARE_PROVIDER_SITE_OTHER): Payer: Medicaid Other | Admitting: Certified Nurse Midwife

## 2018-12-02 ENCOUNTER — Other Ambulatory Visit: Payer: Self-pay

## 2018-12-02 ENCOUNTER — Encounter: Payer: Self-pay | Admitting: Certified Nurse Midwife

## 2018-12-02 DIAGNOSIS — Z1389 Encounter for screening for other disorder: Secondary | ICD-10-CM

## 2018-12-02 DIAGNOSIS — Z975 Presence of (intrauterine) contraceptive device: Secondary | ICD-10-CM

## 2018-12-02 NOTE — Progress Notes (Signed)
  Subjective:     Virginia Bennett is a 23 y.o. female who presents for a postpartum visit. She is 4 weeks postpartum following a spontaneous vaginal delivery. I have fully reviewed the prenatal and intrapartum course. The delivery was at 38.0 gestational weeks. Outcome: spontaneous vaginal delivery. Anesthesia: epidural. Postpartum course has been Unremarkable. Baby's course has been Unremarkable. Baby is feeding by both breast and bottle - Similac Neosure. Bleeding no bleeding. Bowel function is normal. Bladder function is normal. Patient is not sexually active. Contraception method is Nexplanon. Postpartum depression screening: negative.  The following portions of the patient's history were reviewed and updated as appropriate: allergies, current medications, past medical history and problem list.  Review of Systems A comprehensive review of systems was negative.   Objective:    BP 124/73   Pulse 71   Ht 5\' 4"  (1.626 m)   Wt 191 lb (86.6 kg)   LMP 02/07/2018   Breastfeeding Yes   BMI 32.79 kg/m   General:  alert, cooperative and no distress   Breasts:  inspection negative, no nipple discharge or bleeding, no masses or nodularity palpable  Lungs: clear to auscultation bilaterally  Heart:  regular rate and rhythm  Abdomen: soft, non-tender; bowel sounds normal; no masses,  no organomegaly   Vulva:  not evaluated  Vagina: not evaluated  Cervix:  not evaluated  Corpus: not examined  Adnexa:  not evaluated  Rectal Exam: Not performed.        Assessment/Plan:  1. Postpartum care and examination - Normal postpartum exam. Pap smear not done at today's visit. Patient plans to return in January for annual examination and pap.   2. Nexplanon in place - Palpated Nexplanon in left arm, patient has no complaints or concerns    Follow up in: 2 months for annual examination or as needed.    Lajean Manes, CNM 12/02/18, 1:14 PM

## 2019-02-03 ENCOUNTER — Ambulatory Visit: Payer: Medicaid Other | Admitting: Certified Nurse Midwife

## 2019-02-21 ENCOUNTER — Ambulatory Visit: Payer: Medicaid Other | Admitting: Certified Nurse Midwife

## 2019-02-21 ENCOUNTER — Encounter: Payer: Self-pay | Admitting: Certified Nurse Midwife

## 2019-03-15 ENCOUNTER — Ambulatory Visit (INDEPENDENT_AMBULATORY_CARE_PROVIDER_SITE_OTHER): Payer: Managed Care, Other (non HMO) | Admitting: Obstetrics and Gynecology

## 2019-03-15 ENCOUNTER — Encounter: Payer: Self-pay | Admitting: Obstetrics and Gynecology

## 2019-03-15 ENCOUNTER — Other Ambulatory Visit: Payer: Self-pay

## 2019-03-15 ENCOUNTER — Other Ambulatory Visit (HOSPITAL_COMMUNITY)
Admission: RE | Admit: 2019-03-15 | Discharge: 2019-03-15 | Disposition: A | Discharge status: Home / Self Care | Payer: Managed Care, Other (non HMO) | Source: Ambulatory Visit | Attending: Obstetrics and Gynecology | Admitting: Obstetrics and Gynecology

## 2019-03-15 VITALS — BP 127/81 | HR 102 | Wt 214.0 lb

## 2019-03-15 DIAGNOSIS — Z124 Encounter for screening for malignant neoplasm of cervix: Secondary | ICD-10-CM

## 2019-03-15 DIAGNOSIS — Z113 Encounter for screening for infections with a predominantly sexual mode of transmission: Secondary | ICD-10-CM | POA: Insufficient documentation

## 2019-03-15 DIAGNOSIS — Z01419 Encounter for gynecological examination (general) (routine) without abnormal findings: Secondary | ICD-10-CM

## 2019-03-15 DIAGNOSIS — M545 Low back pain, unspecified: Secondary | ICD-10-CM

## 2019-03-15 NOTE — Progress Notes (Signed)
Patient presents for Annual Exam.  Last pap: N/A due to age.  LMP: 03/08/19 last cycle was 7-10 days usually 3 days  Contraception: Nexplanon 10/2018 STD Screening; Full Panel  Family Hx of Breast Cancer: YES   CC: back pain that is constant, denies any pelvic pain no pain with urination. Pt also notes vaginal odor.

## 2019-03-15 NOTE — Progress Notes (Signed)
   WELL-WOMAN PHYSICAL & PAP Patient name: Virginia Bennett MRN [id number]  Date of birth: April 04, 1995 Chief Complaint:   Gynecologic Exam  History of Present Illness:   Virginia Bennett is a 24 y.o. [id number] African American female being seen today for a routine well-woman exam.  Current complaints: mid to low, LT side back pain x 2-3 wks, vaginal odor and STI testing.   PCP: none      does desire labs Patient's last menstrual period was 03/08/2019 (exact date). The current method of family planning is Nexplanon.  Last pap n/a. Results were: n/a Last mammogram: n/a. Results were: n/a. Family h/o breast cancer: Yes Last colonoscopy: n/a. Results were: n/a. Family h/o colorectal cancer: No Review of Systems:   Pertinent items are noted in HPI Denies any headaches, blurred vision, fatigue, shortness of breath, chest pain, abdominal pain, abnormal vaginal discharge/itching/odor/irritation, problems with periods, bowel movements, urination, or intercourse unless otherwise stated above. Pertinent History Reviewed:  Reviewed past medical,surgical, social and family history.  Reviewed problem list, medications and allergies. Physical Assessment:   Vitals:   03/15/19 1459  BP: 127/81  Pulse: (!) 102  Weight: 214 lb (97.1 kg)  Body mass index is 36.73 kg/m.        Physical Examination:   General appearance - well appearing, and in no distress  Mental status - alert, oriented to person, place, and time  Psych:  She has a normal mood and affect  Skin - warm and dry, normal color, no suspicious lesions noted  Chest - effort normal, all lung fields clear to auscultation bilaterally  Heart - normal rate and regular rhythm  Neck:  midline trachea, no thyromegaly or nodules  Breasts - breasts appear normal, no suspicious masses, no skin or nipple changes or  axillary nodes  Abdomen - soft, nontender, nondistended, no masses or organomegaly  Pelvic - VULVA: normal appearing vulva with no  masses, tenderness or lesions  VAGINA: normal appearing vagina with normal color and discharge, no lesions  CERVIX: normal appearing cervix without discharge or lesions, no CMT  Thin prep pap is done without HR HPV cotesting d/t age  UTERUS: uterus is felt to be normal size, shape, consistency and nontender   ADNEXA: No adnexal masses or tenderness noted.  Rectal - deferred  Extremities:  No swelling or varicosities noted  No results found for this or any previous visit (from the past 24 hour(s)).  Assessment & Plan:  1) Encounter for well woman exam with routine gynecological exam  - Cytology - PAP  2) Screening for STD (sexually transmitted disease)  - Cervicovaginal ancillary only( [place]),  - Hepatitis B surface antigen,  - Hepatitis C antibody,  - HIV Antibody (routine testing w rflx),  - RPR  3) Screening for cervical cancer  - Cytology - PAP  4) Acute left-sided low back pain without sciatica  - Urine Culture,  - Ambulatory referral to Chiropractic    Labs/procedures today: STI testing, Pap, Wet Prep  Orders Placed This Encounter  Procedures  . Urine Culture  . Hepatitis B surface antigen  . Hepatitis C antibody  . HIV Antibody (routine testing w rflx)  . RPR  . Ambulatory referral to Chiropractic    Meds: No orders of the defined types were placed in this encounter.   Follow-up: Return in about 1 year (around 03/14/2020) for Annual Exam.  Raelyn Mora MSN, CNM 03/15/2019 3:15 PM

## 2019-03-16 ENCOUNTER — Other Ambulatory Visit: Payer: Self-pay | Admitting: Obstetrics and Gynecology

## 2019-03-16 DIAGNOSIS — B9689 Other specified bacterial agents as the cause of diseases classified elsewhere: Secondary | ICD-10-CM

## 2019-03-16 LAB — CYTOLOGY - PAP
Chlamydia: NEGATIVE
Comment: NEGATIVE
Comment: NEGATIVE
Comment: NORMAL
Diagnosis: NEGATIVE
Neisseria Gonorrhea: NEGATIVE
Trichomonas: NEGATIVE

## 2019-03-16 LAB — CERVICOVAGINAL ANCILLARY ONLY
Bacterial Vaginitis (gardnerella): POSITIVE — AB
Candida Glabrata: NEGATIVE
Candida Vaginitis: NEGATIVE
Chlamydia: NEGATIVE
Comment: NEGATIVE
Comment: NEGATIVE
Comment: NEGATIVE
Comment: NEGATIVE
Comment: NEGATIVE
Comment: NORMAL
Neisseria Gonorrhea: NEGATIVE
Trichomonas: NEGATIVE

## 2019-03-16 LAB — HIV ANTIBODY (ROUTINE TESTING W REFLEX): HIV Screen 4th Generation wRfx: NONREACTIVE

## 2019-03-16 LAB — HEPATITIS C ANTIBODY: Hep C Virus Ab: <0.1 s/co ratio (ref 0.0–0.9)

## 2019-03-16 LAB — HEPATITIS B SURFACE ANTIGEN: Hepatitis B Surface Ag: NEGATIVE

## 2019-03-16 LAB — RPR: RPR Ser Ql: NONREACTIVE

## 2019-03-16 MED ORDER — METRONIDAZOLE 500 MG PO TABS: 500.0000 mg | ORAL_TABLET | Freq: Two times a day (BID) | ORAL | 0 refills | Status: DC

## 2019-03-16 NOTE — Progress Notes (Signed)
Notified via My Chart message of BV tx

## 2019-03-17 LAB — URINE CULTURE

## 2020-04-01 IMAGING — US US MFM OB DETAIL +14 WK
1 series · 13 of 28 positions shown · non-contrast
Comparison: none

[Series 1: us mfm ob detail +14 wk · 13 of 111 slices shown]
[im 5/111]
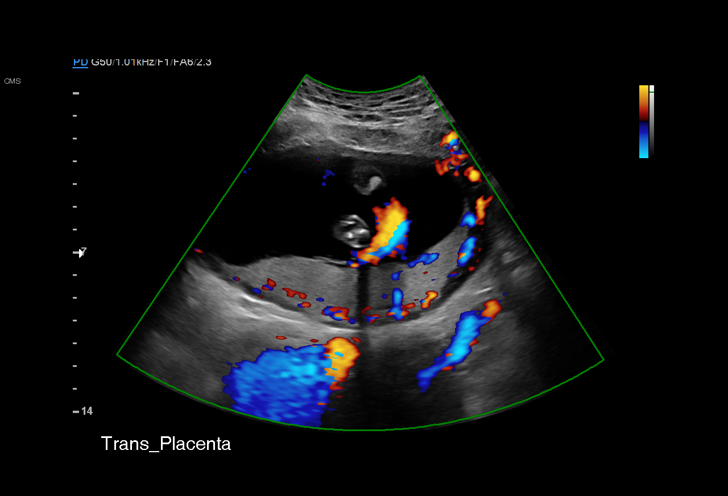
[im 13/111]
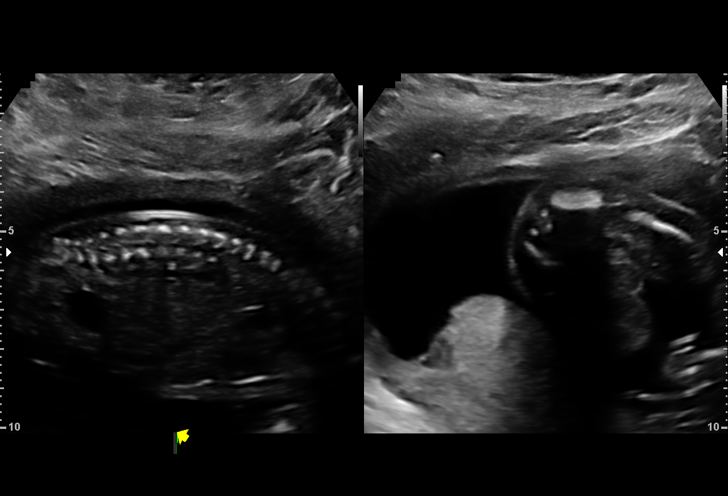
[im 21/111]
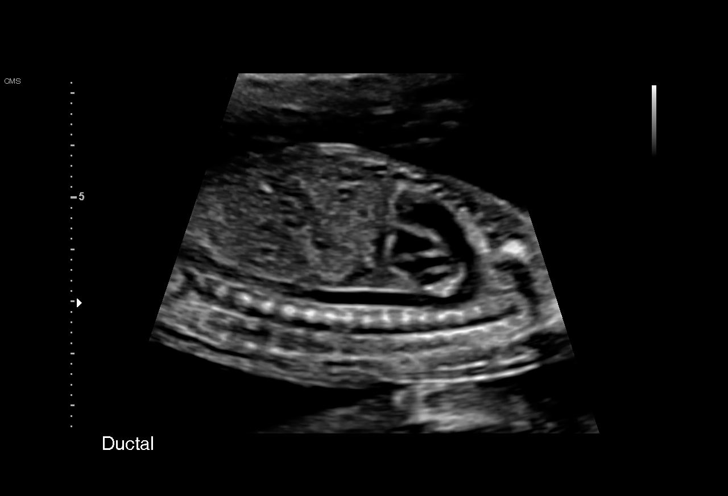
[im 29/111]
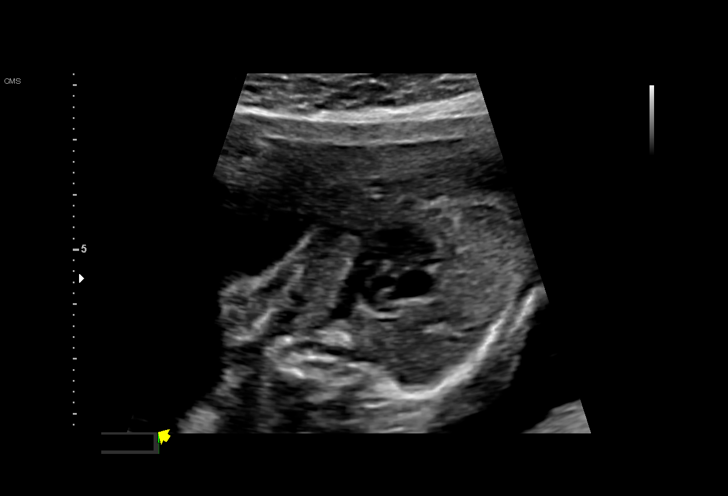
[im 37/111]
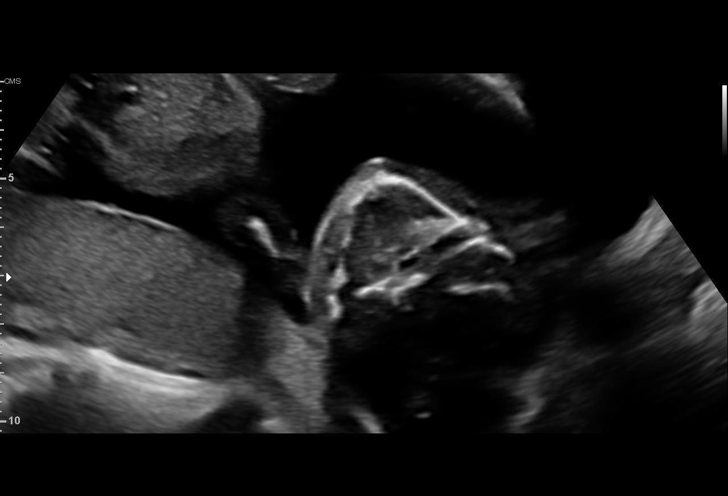
[im 45/111]
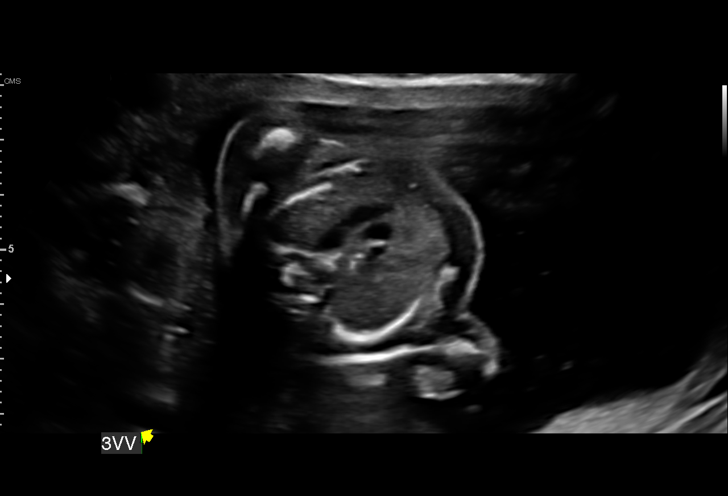
[im 58/111]
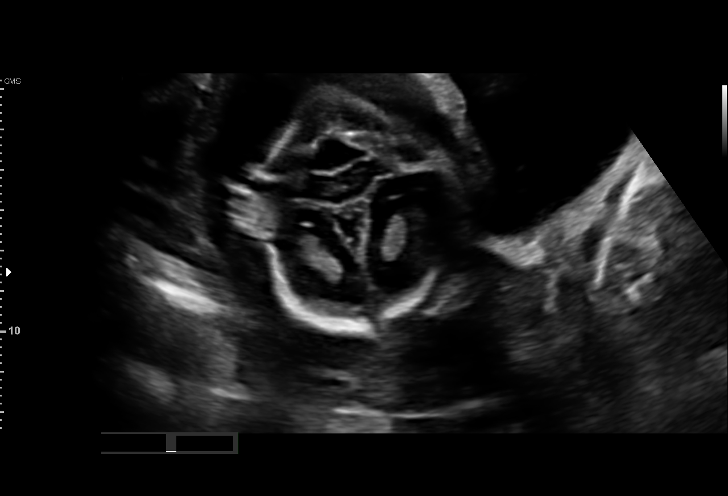
[im 66/111]
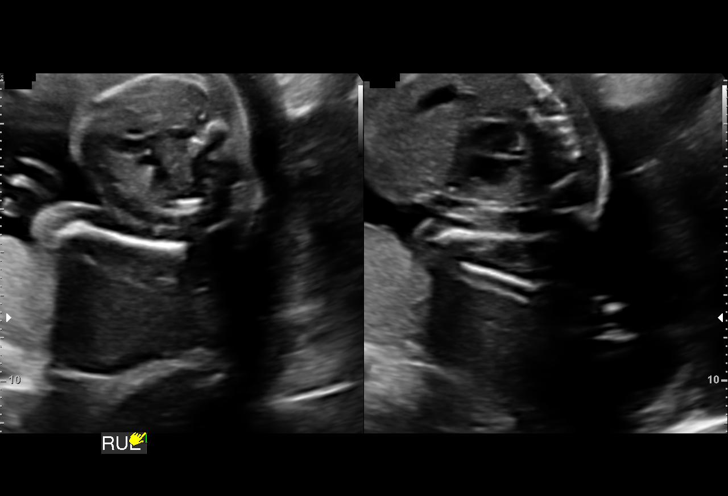
[im 74/111]
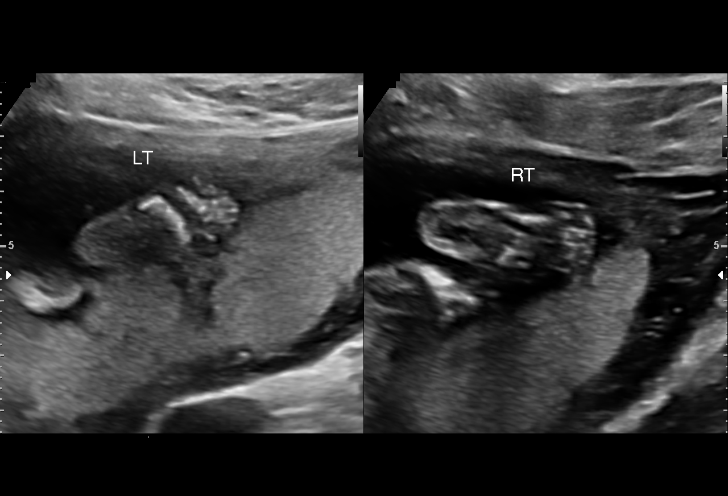
[im 82/111]
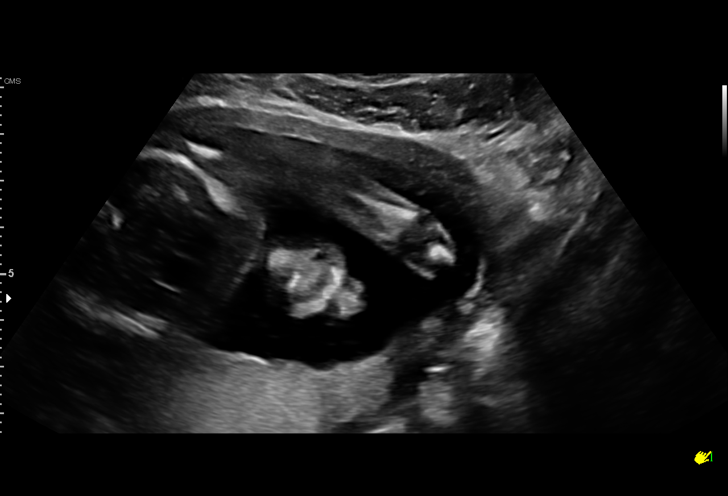
[im 90/111]
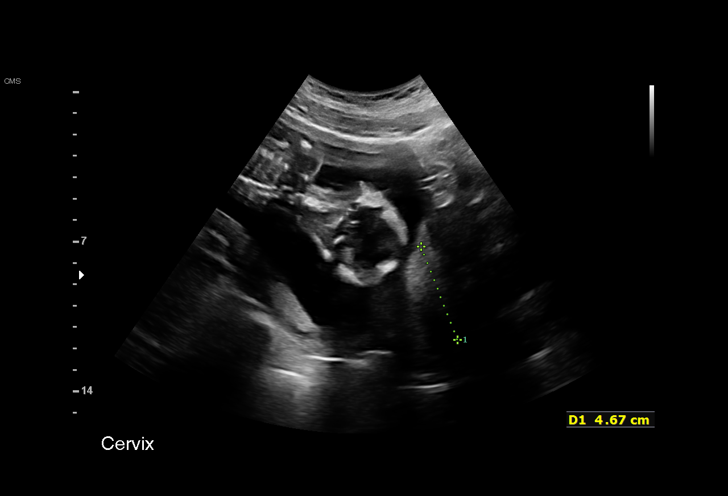
[im 98/111]
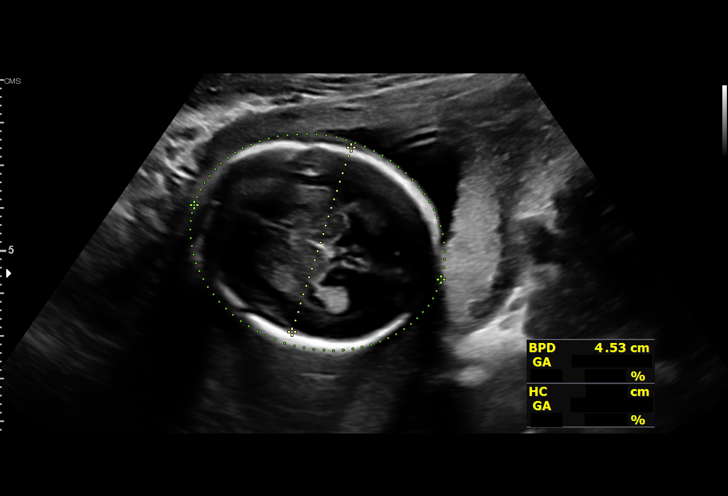
[im 106/111]
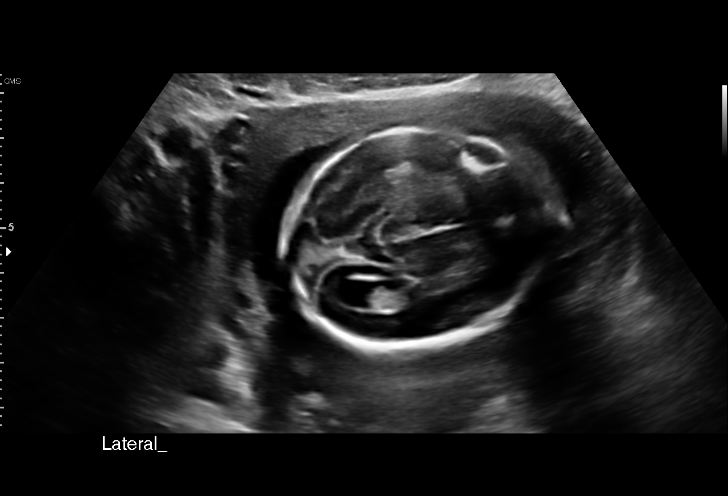

[13 of 28 positions shown; findings below may reference images not displayed]

----------------------------------------------------------------------

 ----------------------------------------------------------------------
Indications

  Antenatal screening for malformations
  Genetic carrier (silent alpha thalassemia)
  Fetal abnormality - other known or
  suspected (LVEIF) - low risk NIPS
  Obesity complicating pregnancy, second
  trimester
  19 weeks gestation of pregnancy
 ----------------------------------------------------------------------
Fetal Evaluation

 Num Of Fetuses:         1
 Fetal Heart Rate(bpm):  150
 Cardiac Activity:       Observed
 Presentation:           Cephalic
 Placenta:               Posterior
 P. Cord Insertion:      Visualized

 Amniotic Fluid
 AFI FV:      Within normal limits

                             Largest Pocket(cm)

Biometry

 BPD:      45.3  mm     G. Age:  19w 5d         56  %    CI:        69.27   %    70 - 86
                                                         FL/HC:      17.7   %    16.8 -
 HC:      173.8  mm     G. Age:  19w 6d         59  %    HC/AC:      1.19        1.09 -
 AC:      145.8  mm     G. Age:  19w 6d         55  %    FL/BPD:     68.0   %
 FL:       30.8  mm     G. Age:  19w 4d         42  %    FL/AC:      21.1   %    20 - 24
 HUM:      30.6  mm     G. Age:  20w 1d         66  %
 CER:      21.7  mm     G. Age:  20w 5d         70  %
 NFT:       5.2  mm
 CM:        5.6  mm

 Est. FW:     310  gm    0 lb 11 oz      51  %
OB History

 Gravidity:    1         Term:   0        Prem:   0        SAB:   0
 TOP:          0       Ectopic:  0        Living: 0
Gestational Age

 LMP:           19w 4d        Date:  02/07/18                 EDD:   11/14/18
 U/S Today:     19w 5d                                        EDD:   11/13/18
 Best:          19w 4d     Det. By:  LMP  (02/07/18)          EDD:   11/14/18
Anatomy

 Cranium:               Appears normal         LVOT:                   Appears normal
 Cavum:                 Appears normal         Aortic Arch:            Appears normal
 Ventricles:            Appears normal         Ductal Arch:            Appears normal
 Choroid Plexus:        Appears normal         Diaphragm:              Appears normal
 Cerebellum:            Appears normal         Stomach:                Appears normal, left
                                                                       sided
 Posterior Fossa:       Appears normal         Abdomen:                Appears normal
 Nuchal Fold:           Appears normal         Abdominal Wall:         Appears nml (cord
                                                                       insert, abd wall)
 Face:                  Appears normal         Cord Vessels:           Appears normal (3
                        (orbits and profile)                           vessel cord)
 Lips:                  Appears normal         Kidneys:                Appear normal
 Palate:                Appears normal         Bladder:                Appears normal
 Thoracic:              Appears normal         Spine:                  Appears normal
 Heart:                 Echogenic focus        Upper Extremities:      Appears normal
                        in LV
 RVOT:                  Appears normal         Lower Extremities:      Appears normal

 Other:  Heels and 5th digit visualized. Nasal bone visualized.
Cervix Uterus Adnexa

 Cervix
 Length:            3.8  cm.
 Normal appearance by transabdominal scan.

 Uterus
 No abnormality visualized.

 Left Ovary
 Within normal limits.

 Right Ovary
 Within normal limits.

 Cul De Sac
 No free fluid seen.

 Adnexa
 No abnormality visualized.
Impression

 We performed fetal anatomy scan. An echogenic intracardiac
 focus is seen. No other makers of aneuploidies or fetal
 structural defects are seen. Fetal biometry is consistent with
 her previously-established dates. Amniotic fluid is normal and
 good fetal activity is seen.

 On cell-free fetal DNA screening, the risks of fetal
 aneuploidies are not increased.
 I informed the patient that given that she had Harutada Luyo Oshiro for fetal
 aneuploidies on cell-free fetal DNA screening, finding of
 echogenic intracardiac focus should be considered a normal
 variant and that the risk of trisomy 21 is not increased. I also
 reassured that echogenic focus does not increase the risk of
 cardiac defects. I also informed her that only amniocentesis
 will give a defintive result on the fetal karyotype.
 Patient opted not to have amniocentesis.
                 Amazigh, Quirijn
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
## 2023-01-21 NOTE — L&D Delivery Note (Signed)
 Delivery Note At 4:23 AM a viable female was delivered via Vaginal, Spontaneous (Presentation: Right Occiput Anterior).  APGAR: 8, 9; weight  .    Delivery of infant by Harlene Duncans Placenta status: Spontaneous, Intact.  Cord: 3 vessels with the following complications: None. Placenta delivered by Dr Armond .  No lacerations noted.  Pt with brisk bleeding.  One gram of TXA given.  Asked the RN to have pt empty her bladder  Anesthesia: None Episiotomy: None Lacerations:  none Suture Repair:   Est. Blood Loss (mL):    Mom to postpartum.  Baby to Couplet care / Skin to Skin.  Kelyse Pask A Dedric Ethington 10/28/2023, 5:09 AM

## 2023-02-23 ENCOUNTER — Emergency Department (HOSPITAL_BASED_OUTPATIENT_CLINIC_OR_DEPARTMENT_OTHER)
Admission: EM | Admit: 2023-02-23 | Discharge: 2023-02-23 | Disposition: A | Discharge status: Home / Self Care | Payer: Medicaid Other

## 2023-02-23 ENCOUNTER — Emergency Department (HOSPITAL_BASED_OUTPATIENT_CLINIC_OR_DEPARTMENT_OTHER): Payer: Medicaid Other

## 2023-02-23 ENCOUNTER — Encounter (HOSPITAL_BASED_OUTPATIENT_CLINIC_OR_DEPARTMENT_OTHER): Payer: Self-pay | Admitting: Emergency Medicine

## 2023-02-23 ENCOUNTER — Other Ambulatory Visit: Payer: Self-pay

## 2023-02-23 DIAGNOSIS — B9689 Other specified bacterial agents as the cause of diseases classified elsewhere: Secondary | ICD-10-CM | POA: Insufficient documentation

## 2023-02-23 DIAGNOSIS — O26891 Other specified pregnancy related conditions, first trimester: Secondary | ICD-10-CM | POA: Diagnosis present

## 2023-02-23 DIAGNOSIS — O208 Other hemorrhage in early pregnancy: Secondary | ICD-10-CM

## 2023-02-23 DIAGNOSIS — Z3A01 Less than 8 weeks gestation of pregnancy: Secondary | ICD-10-CM | POA: Insufficient documentation

## 2023-02-23 DIAGNOSIS — R8271 Bacteriuria: Secondary | ICD-10-CM

## 2023-02-23 DIAGNOSIS — O23591 Infection of other part of genital tract in pregnancy, first trimester: Secondary | ICD-10-CM | POA: Diagnosis not present

## 2023-02-23 DIAGNOSIS — O368910 Maternal care for other specified fetal problems, first trimester, not applicable or unspecified: Secondary | ICD-10-CM | POA: Diagnosis not present

## 2023-02-23 LAB — COMPREHENSIVE METABOLIC PANEL
ALT: 11 U/L (ref 0–44)
AST: 15 U/L (ref 15–41)
Albumin: 3.9 g/dL (ref 3.5–5.0)
Alkaline Phosphatase: 49 U/L (ref 38–126)
Anion gap: 9 (ref 5–15)
BUN: 9 mg/dL (ref 6–20)
CO2: 21 mmol/L — ABNORMAL LOW (ref 22–32)
Calcium: 9.2 mg/dL (ref 8.9–10.3)
Chloride: 108 mmol/L (ref 98–111)
Creatinine, Ser: 0.62 mg/dL (ref 0.44–1.00)
GFR, Estimated: >60 mL/min (ref >60)
Glucose, Bld: 82 mg/dL (ref 70–99)
Potassium: 3.8 mmol/L (ref 3.5–5.1)
Sodium: 138 mmol/L (ref 135–145)
Total Bilirubin: 0.5 mg/dL (ref 0.0–1.2)
Total Protein: 7.2 g/dL (ref 6.5–8.1)

## 2023-02-23 LAB — PREGNANCY, URINE: Preg Test, Ur: POSITIVE — AB

## 2023-02-23 LAB — WET PREP, GENITAL
Sperm: NONE SEEN
Trich, Wet Prep: NONE SEEN
WBC, Wet Prep HPF POC: >=10 — AB (ref <10)
Yeast Wet Prep HPF POC: NONE SEEN

## 2023-02-23 LAB — CBC
HCT: 40 % (ref 36.0–46.0)
Hemoglobin: 13.2 g/dL (ref 12.0–15.0)
MCH: 28.1 pg (ref 26.0–34.0)
MCHC: 33 g/dL (ref 30.0–36.0)
MCV: 85.1 fL (ref 80.0–100.0)
Platelets: 336 10*3/uL (ref 150–400)
RBC: 4.7 MIL/uL (ref 3.87–5.11)
RDW: 12.7 % (ref 11.5–15.5)
WBC: 5 10*3/uL (ref 4.0–10.5)
nRBC: 0 % (ref 0.0–0.2)

## 2023-02-23 LAB — HCG, QUANTITATIVE, PREGNANCY: hCG, Beta Chain, Quant, S: 2357 m[IU]/mL — ABNORMAL HIGH (ref <5)

## 2023-02-23 LAB — URINALYSIS, ROUTINE W REFLEX MICROSCOPIC
Bilirubin Urine: NEGATIVE
Glucose, UA: NEGATIVE mg/dL
Hgb urine dipstick: NEGATIVE
Nitrite: NEGATIVE
Specific Gravity, Urine: 1.029 (ref 1.005–1.030)
pH: 6 (ref 5.0–8.0)

## 2023-02-23 LAB — LIPASE, BLOOD: Lipase: <10 U/L — ABNORMAL LOW (ref 11–51)

## 2023-02-23 MED ORDER — METRONIDAZOLE 500 MG PO TABS: 500.0000 mg | ORAL_TABLET | Freq: Two times a day (BID) | ORAL | 0 refills | Status: DC

## 2023-02-23 MED ORDER — ACETAMINOPHEN 500 MG PO TABS
1000.0000 mg | ORAL_TABLET | Freq: Once | ORAL | Status: AC
  Administered 2023-02-23: 1000 mg via ORAL
  Filled 2023-02-23: qty 2

## 2023-02-23 MED ORDER — CEPHALEXIN 500 MG PO CAPS: 500.0000 mg | ORAL_CAPSULE | Freq: Two times a day (BID) | ORAL | 0 refills | Status: AC

## 2023-02-23 NOTE — ED Provider Notes (Signed)
[place] [facility]ERGENCY DEPARTMENT AT [facility] Provider Note   CSN: [id number] Arrival date & time: 02/23/23  2130     History  Chief Complaint  Patient presents with   Abdominal Pain    Virginia Bennett is a 28 y.o. female with no significant past medical history presents with concern for a positive home pregnancy test and some abdominal pain over the past couple days.  Also reports this morning she noticed some blood on the toilet paper when she wiped.  Reports some nausea as well as breast tenderness.  Also reports some white paste-y vaginal discharge that has been going on for couple days.  Denies any vaginal pain.  Denies any concern for STIs.  Denies any dysuria, hematuria, increased frequency.   Abdominal Pain      Home Medications Prior to Admission medications   Medication Sig Start Date End Date Taking? Authorizing Provider  cephALEXin (KEFLEX) 500 MG capsule Take 1 capsule (500 mg total) by mouth 2 (two) times daily for 5 days. 02/23/23 02/28/23 Yes Arabella Merles, PA-C  metroNIDAZOLE (FLAGYL) 500 MG tablet Take 1 tablet (500 mg total) by mouth 2 (two) times daily. 02/23/23  Yes Arabella Merles, PA-C  acetaminophen (TYLENOL) 325 MG tablet Take 2 tablets (650 mg total) by mouth every 4 (four) hours as needed (for pain scale < 4). 11/02/18   Celedonio Savage, MD  Prenatal Vit-Fe Phos-FA-Omega (VITAFOL GUMMIES) 3.33-0.333-34.8 MG CHEW Chew 3 tablets by mouth daily. 04/29/18   Sharyon Cable, CNM      Allergies    Aspirin    Review of Systems   Review of Systems  Gastrointestinal:  Positive for abdominal pain.    Physical Exam Updated Vital Signs BP 120/74 (BP Location: Right Arm)   Pulse 80   Temp 98.4 F (36.9 C) (Oral)   Resp 18   Wt 93 kg   LMP 01/21/2023   SpO2 100%   BMI 35.19 kg/m  Physical Exam Vitals and nursing note reviewed. Exam conducted with a chaperone present.  Constitutional:      General: She is not in acute distress.     Appearance: She is well-developed.     Comments: Patient very well-appearing, moving uncomfortably.  No vomiting  HENT:     Head: Normocephalic and atraumatic.  Eyes:     Conjunctiva/sclera: Conjunctivae normal.  Cardiovascular:     Rate and Rhythm: Normal rate and regular rhythm.     Heart sounds: No murmur heard. Pulmonary:     Effort: Pulmonary effort is normal. No respiratory distress.     Breath sounds: Normal breath sounds.  Abdominal:     Palpations: Abdomen is soft.     Tenderness: There is no abdominal tenderness.     Comments: Abdomen soft nontender to palpation  Genitourinary:    Comments: Moderate amount of whitish appearing discharge.  No blood in the vaginal vault.  Cervix closed.  No cervical motion tenderness. Musculoskeletal:        General: No swelling.     Cervical back: Neck supple.  Skin:    General: Skin is warm and dry.     Capillary Refill: Capillary refill takes less than 2 seconds.  Neurological:     Mental Status: She is alert.  Psychiatric:        Mood and Affect: Mood normal.     ED Results / Procedures / Treatments   Labs (all labs ordered are listed, but only abnormal results are displayed) Labs Reviewed  WET PREP, GENITAL - Abnormal; Notable for the following components:      Result Value   Clue Cells Wet Prep HPF POC PRESENT (*)    WBC, Wet Prep HPF POC >=10 (*)    All other components within normal limits  PREGNANCY, URINE - Abnormal; Notable for the following components:   Preg Test, Ur POSITIVE (*)    All other components within normal limits  HCG, QUANTITATIVE, PREGNANCY - Abnormal; Notable for the following components:   hCG, Beta Chain, Quant, S 2,357 (*)    All other components within normal limits  COMPREHENSIVE METABOLIC PANEL - Abnormal; Notable for the following components:   CO2 21 (*)    All other components within normal limits  LIPASE, BLOOD - Abnormal; Notable for the following components:   Lipase <10 (*)    All  other components within normal limits  URINALYSIS, ROUTINE W REFLEX MICROSCOPIC - Abnormal; Notable for the following components:   Color, Urine ORANGE (*)    APPearance CLOUDY (*)    Ketones, ur TRACE (*)    Protein, ur TRACE (*)    Leukocytes,Ua LARGE (*)    Bacteria, UA MANY (*)    All other components within normal limits  CBC    EKG None  Radiology US OB LESS THAN 14 WEEKS WITH OB TRANSVAGINAL Result Date: 02/23/2023 CLINICAL DATA:  Vaginal bleeding EXAM: OBSTETRIC <14 WK [place] AND TRANSVAGINAL OB US TECHNIQUE: Both transabdominal and transvaginal ultrasound examinations were performed for complete evaluation of the gestation as well as the maternal uterus, adnexal regions, and pelvic cul-de-sac. Transvaginal technique was performed to assess early pregnancy. COMPARISON:  None Available. FINDINGS: Intrauterine gestational sac: Single Yolk sac:  Visualized. Embryo:  Questionable visualized Cardiac Activity: Not Visualized. MSD: 6.0 mm   5 w   2 d Subchorionic hemorrhage: Moderate subchorionic hemorrhage measures 11 x 2 mm. Maternal uterus/adnexae: Normal ovaries. Left ovarian corpus luteum. IMPRESSION: 1. Probable early intrauterine gestational sac and yolk sac, but no definite fetal pole or cardiac activity yet visualized. Recommend follow-up quantitative B-HCG levels and follow-up US in 14 days to assess viability. This recommendation follows SRU consensus guidelines: Diagnostic Criteria for Nonviable Pregnancy Early in the First Trimester. Malva Limes Med 2013; 914:7829-56. 2. Moderate subchorionic hemorrhage. Electronically Signed   By: Agustin Cree M.D.   On: 02/23/2023 16:18    Procedures Procedures    Medications Ordered in ED Medications  acetaminophen (TYLENOL) tablet 1,000 mg (1,000 mg Oral Given 02/23/23 1413)    ED Course/ Medical Decision Making/ A&P                                 Medical Decision Making Amount and/or Complexity of Data Reviewed Labs: ordered. Radiology:  ordered.  Risk OTC drugs. Prescription drug management.     Differential diagnosis includes but is not limited to Cholelithiasis, cholangitis, choledocholithiasis, peptic ulcer, gastritis, gastroenteritis, appendicitis, IBS, IBD, DKA, nephrolithiasis, UTI, pyelonephritis, pancreatitis, diverticulitis, mesenteric ischemia, abdominal aortic aneurysm, small bowel obstruction, volvulus, testicular torsion in males, ovarian torsion and pregnancy related concerns in females of childbearing age    ED Course:  Patient very well-appearing, stable vital signs.  Reported some abdominal pain for the past couple days as well as a positive home pregnancy test.  She is given Tylenol here for pain.  Her abdomen is soft nontender here today.  No active vomiting.  Performed pelvic exam with RN  chaperone present.  No blood in the vaginal vault, cervix closed, no cervical motion tenderness.  Moderate amount of white discharge.  Tested positive for BV which explains the increased discharge she has been concerned about. I Ordered, and personally interpreted labs.  The pertinent results include:   Beta-hCG at 2357 CMP, CBC unremarkable. Lipase within normal limits Urinalysis with large amount of squamous epithelial cells, and leukocytes.  Negative nitrites.  Trace ketones and protein. Wet prep with clue cells and white blood cells.  No yeast or trichomoniasis Given unremarkable CMP, CBC, lipase, and abdomen soft nontender, low concern for acute abdominal pathology at this time.  Feel her abdominal pain is explained by her pregnancy.  Patient reported some blood on the toilet paper today, but no blood seen in her urine, or on pelvic exam.  Cervix closed. Pelvic US showed probable intrauterine gestational sac and yolk sac.  There is also a moderate subchorionic hemorrhage noted, this is likely where patient's vaginal bleeding was from.  I discussed these results with patient that she needs to have a beta-hCG level drawn  in 48 hours, and have a follow-up ultrasound in 14 days.  She does have a OB/GYN.  She voiced understanding. Urinalysis does have leukocytes, and given she is pregnant, will treat for asymptomatic bacteriuria in pregnancy.  She is not having any dysuria, hematuria, increased frequency.  No leukocytosis, no flank pain, no concern for pyelonephritis at this time.  Also treat for BV given clue cells seen on wet prep. Will prescribe metronidazole as this is the recommended treatment in pregnancy per UpToDate as of the writing of this note. I discussed either starting her antibiotic here, or I can prescribe for her to take at home.  She would prefer to start all of her antibiotics at home.   Patient stable and appropriate for discharge home this time  Impression: Bacterial Vaginosis Asymptomatic bacteriuria in pregnancy Positive pregnancy test  Disposition:  The patient was discharged home with instructions to follow-up with her OB/GYN or at the women and children Center at [facility] for repeat beta-hCG in 48 hours.  Take the prescribed Keflex and metronidazole to treat her BV and bacteriuria in pregnancy.  Take prenatal daily vitamin.  Keep your scheduled appoint with her OBGYN for routine prenatal care Return precautions given.  Imaging Studies ordered: I ordered imaging studies including pelvic ultrasound I independently visualized the imaging with scope of interpretation limited to determining acute life threatening conditions related to emergency care. Imaging showed probable intrauterine gestational sac and yolk sac, but no definite fetal pole or cardiac activity.  Moderate subchorionic hemorrhage noted.  Radiologist recommended follow-up beta hCG level and follow-up ultrasound in 14 days. I agree with the radiologist interpretation              Final Clinical Impression(s) / ED Diagnoses Final diagnoses:  Less than [redacted] weeks gestation of pregnancy  Bacterial vaginosis in pregnancy   Asymptomatic bacteriuria during pregnancy in first trimester  Subchorionic hemorrhage in first trimester    Rx / DC Orders ED Discharge Orders          Ordered    metroNIDAZOLE (FLAGYL) 500 MG tablet  2 times daily        02/23/23 1633    cephALEXin (KEFLEX) 500 MG capsule  2 times daily        02/23/23 1634              Arabella Merles, [place] 02/23/23 1638  Coral Spikes, DO 02/24/23 0700

## 2023-02-23 NOTE — ED Notes (Signed)
 Discharge paperwork given and verbally understood.

## 2023-02-23 NOTE — ED Triage Notes (Signed)
Pt c/o lower ABD pain with vaginal bleed when wiping. Reports home preg test positive

## 2023-02-23 NOTE — Discharge Instructions (Addendum)
Your pregnancy test was positive here today.  Your beta-hCG is at 2357.  This places you at approximately 4 weeks long.  You need to have this lab value rechecked in 48 hours to ensure the pregnancy is progressing as it should be. On your ultrasound today, we see a probable intrauterine pregnancy (pregnancy in the right location. We also see a subchorionic hemorrhage which is likely where your bleeding came from. This is a small separation of the placenta from the wall of the uterus. It is recommended that you get a repeat vaginal ultrasound in 14 days for further evaluation of the subchorionic hemorrhage.  I have attached your ultrasound findings below.  Please make appointment with your OB/GYN, or if you are unable to get an appointment with them, please go to to the [facility] at [address] [place], [place], [place] [address]. You do not need an appointment.  You tested positive for BV (bacterial vaginosis) today.  Also appears like your urine may have an infection.  You have been prescribed metronidazole to treat the BV. Take this antibiotic 2 times a day for the next 7 days. You have been prescribed Cephalexin to take to treat your urinary tract infection. Please take this twice daily for the next 5 days. Take the full course of your antibiotic even if you start feeling better. Antibiotics may cause you to have diarrhea.  Your electrolytes, blood counts, kidney, liver, and pancreas labs are normal today.  Please start taking a daily prenatal vitamin.  Please keep your appointment with your OB/GYN for routine prenatal care.  There are many options for quick evaluation and management of certain GYN issues that do not require a long wait time or big bill from the emergency department.   If you continue to experience vaginal bleeding, begin passing clots, or have any other pregnancy related concerns, please go directly to: [facility] at [address] [place], [place], [place] [address]. You do not need an appointment.   Return to the ER if you have fevers, severe abdominal pain, any other new or concerning symptoms.  EXAM:  OBSTETRIC <14 WK [place] AND TRANSVAGINAL OB US    TECHNIQUE:  Both transabdominal and transvaginal ultrasound examinations were  performed for complete evaluation of the gestation as well as the  maternal uterus, adnexal regions, and pelvic cul-de-sac.  Transvaginal technique was performed to assess early pregnancy.    COMPARISON:  None Available.    FINDINGS:  Intrauterine gestational sac: Single    Yolk sac:  Visualized.    Embryo:  Questionable visualized    Cardiac Activity: Not Visualized.    MSD: 6.0 mm   5 w   2 d    Subchorionic hemorrhage: Moderate subchorionic hemorrhage measures  11 x 2 mm.    Maternal uterus/adnexae: Normal ovaries. Left ovarian corpus luteum.    IMPRESSION:  1. Probable early intrauterine gestational sac and yolk sac, but no  definite fetal pole or cardiac activity yet visualized. Recommend  follow-up quantitative B-HCG levels and follow-up US in 14 days to  assess viability. This recommendation follows SRU consensus  guidelines: Diagnostic Criteria for Nonviable Pregnancy Early in the  First Trimester. Malva Limes Med 2013; 284:1324-40.  2. Moderate subchorionic hemorrhage.      Electronically Signed    By: Agustin Cree M.D.    On: 02/23/2023 16:18

## 2023-04-06 LAB — HEPATITIS C ANTIBODY: HCV Ab: NEGATIVE

## 2023-04-09 LAB — OB RESULTS CONSOLE HIV ANTIBODY (ROUTINE TESTING): HIV: NONREACTIVE

## 2023-04-09 LAB — OB RESULTS CONSOLE RUBELLA ANTIBODY, IGM: Rubella: IMMUNE

## 2023-04-09 LAB — OB RESULTS CONSOLE HEPATITIS B SURFACE ANTIGEN: Hepatitis B Surface Ag: NEGATIVE

## 2023-04-10 LAB — OB RESULTS CONSOLE GC/CHLAMYDIA
Chlamydia: NEGATIVE
Neisseria Gonorrhea: NEGATIVE

## 2023-07-29 LAB — OB RESULTS CONSOLE HIV ANTIBODY (ROUTINE TESTING): HIV: NONREACTIVE

## 2023-08-31 ENCOUNTER — Inpatient Hospital Stay (HOSPITAL_COMMUNITY)
Admission: AD | Admit: 2023-08-31 | Discharge: 2023-08-31 | Disposition: A | Discharge status: Home / Self Care | Attending: Obstetrics and Gynecology | Admitting: Obstetrics and Gynecology

## 2023-08-31 ENCOUNTER — Inpatient Hospital Stay (HOSPITAL_BASED_OUTPATIENT_CLINIC_OR_DEPARTMENT_OTHER)

## 2023-08-31 ENCOUNTER — Encounter (HOSPITAL_COMMUNITY): Payer: Self-pay | Admitting: Obstetrics and Gynecology

## 2023-08-31 ENCOUNTER — Other Ambulatory Visit: Payer: Self-pay

## 2023-08-31 DIAGNOSIS — R109 Unspecified abdominal pain: Secondary | ICD-10-CM | POA: Diagnosis not present

## 2023-08-31 DIAGNOSIS — O99353 Diseases of the nervous system complicating pregnancy, third trimester: Secondary | ICD-10-CM | POA: Insufficient documentation

## 2023-08-31 DIAGNOSIS — O288 Other abnormal findings on antenatal screening of mother: Secondary | ICD-10-CM

## 2023-08-31 DIAGNOSIS — O289 Unspecified abnormal findings on antenatal screening of mother: Secondary | ICD-10-CM | POA: Insufficient documentation

## 2023-08-31 DIAGNOSIS — Z3A32 32 weeks gestation of pregnancy: Secondary | ICD-10-CM | POA: Diagnosis not present

## 2023-08-31 DIAGNOSIS — Z113 Encounter for screening for infections with a predominantly sexual mode of transmission: Secondary | ICD-10-CM | POA: Diagnosis present

## 2023-08-31 DIAGNOSIS — R1032 Left lower quadrant pain: Secondary | ICD-10-CM | POA: Insufficient documentation

## 2023-08-31 DIAGNOSIS — G43009 Migraine without aura, not intractable, without status migrainosus: Secondary | ICD-10-CM | POA: Insufficient documentation

## 2023-08-31 DIAGNOSIS — H538 Other visual disturbances: Secondary | ICD-10-CM

## 2023-08-31 LAB — COMPREHENSIVE METABOLIC PANEL WITH GFR
ALT: 14 U/L (ref 0–44)
AST: 18 U/L (ref 15–41)
Albumin: 2.6 g/dL — ABNORMAL LOW (ref 3.5–5.0)
Alkaline Phosphatase: 108 U/L (ref 38–126)
Anion gap: 9 (ref 5–15)
BUN: <5 mg/dL — ABNORMAL LOW (ref 6–20)
CO2: 18 mmol/L — ABNORMAL LOW (ref 22–32)
Calcium: 8.2 mg/dL — ABNORMAL LOW (ref 8.9–10.3)
Chloride: 110 mmol/L (ref 98–111)
Creatinine, Ser: 0.6 mg/dL (ref 0.44–1.00)
GFR, Estimated: >60 mL/min (ref >60)
Glucose, Bld: 74 mg/dL (ref 70–99)
Potassium: 3.4 mmol/L — ABNORMAL LOW (ref 3.5–5.1)
Sodium: 137 mmol/L (ref 135–145)
Total Bilirubin: 0.4 mg/dL (ref 0.0–1.2)
Total Protein: 5.5 g/dL — ABNORMAL LOW (ref 6.5–8.1)

## 2023-08-31 LAB — URINALYSIS, ROUTINE W REFLEX MICROSCOPIC
Bilirubin Urine: NEGATIVE
Glucose, UA: NEGATIVE mg/dL
Hgb urine dipstick: NEGATIVE
Ketones, ur: 80 mg/dL — AB
Leukocytes,Ua: NEGATIVE
Nitrite: NEGATIVE
Protein, ur: NEGATIVE mg/dL
Specific Gravity, Urine: 1.023 (ref 1.005–1.030)
pH: 6 (ref 5.0–8.0)

## 2023-08-31 LAB — CBC
HCT: 33.4 % — ABNORMAL LOW (ref 36.0–46.0)
Hemoglobin: 11 g/dL — ABNORMAL LOW (ref 12.0–15.0)
MCH: 29.2 pg (ref 26.0–34.0)
MCHC: 32.9 g/dL (ref 30.0–36.0)
MCV: 88.6 fL (ref 80.0–100.0)
Platelets: 268 K/uL (ref 150–400)
RBC: 3.77 MIL/uL — ABNORMAL LOW (ref 3.87–5.11)
RDW: 14.5 % (ref 11.5–15.5)
WBC: 6.2 K/uL (ref 4.0–10.5)
nRBC: 0 % (ref 0.0–0.2)

## 2023-08-31 LAB — WET PREP, GENITAL
Clue Cells Wet Prep HPF POC: NONE SEEN
Sperm: NONE SEEN
Trich, Wet Prep: NONE SEEN
WBC, Wet Prep HPF POC: <10 (ref <10)
Yeast Wet Prep HPF POC: NONE SEEN

## 2023-08-31 LAB — PROTEIN / CREATININE RATIO, URINE
Creatinine, Urine: 308 mg/dL
Protein Creatinine Ratio: 0.05 mg/mg{creat} (ref 0.00–0.15)
Total Protein, Urine: 16 mg/dL

## 2023-08-31 MED ORDER — ACETAMINOPHEN 500 MG PO TABS
ORAL_TABLET | ORAL | Status: AC
Start: 2023-08-31 — End: 2023-08-31
  Filled 2023-08-31: qty 1

## 2023-08-31 MED ORDER — ACETAMINOPHEN 500 MG PO TABS
1000.0000 mg | ORAL_TABLET | Freq: Once | ORAL | Status: AC
  Administered 2023-08-31 (×2): 1000 mg via ORAL
  Filled 2023-08-31: qty 2

## 2023-08-31 MED ORDER — CYCLOBENZAPRINE HCL 5 MG PO TABS
10.0000 mg | ORAL_TABLET | Freq: Once | ORAL | Status: AC
Start: 2023-08-31 — End: 2023-08-31
  Administered 2023-08-31 (×2): 10 mg via ORAL
  Filled 2023-08-31: qty 2

## 2023-08-31 NOTE — MAU Note (Signed)
 Virginia Bennett is a 28 y.o. at Unknown here in MAU reporting: she's had blurred vision since yesterday that has continued today.  Reports no current HA, but had a HA twice last week (resolved without meds).  Denies epigastric pain, but endorsed LLQ pain and pelvic pressure.  States pain has been constant for last 2 days, reports the pain as pressure, like he's standing on me or something.  Reports has had increased urinary frequency and urgency, denies pain/burning with urination.  Denies VB or LOF.  Endorses +FM.   LMP: NA Onset of complaint: 2 days Pain score: 6 Vitals:   08/31/23 1324  BP: 116/68  Pulse: 74  Resp: 18  Temp: 98.5 F (36.9 C)  SpO2: 100%     FHT: 153 bpm  Lab orders placed from triage: UA

## 2023-08-31 NOTE — MAU Provider Note (Signed)
 History     CSN: [id number]  Arrival date and time: 08/31/23 1257   Event Date/Time   First Provider Initiated Contact with Patient 08/31/23 1409      Chief Complaint  Patient presents with   Blurred Vision   Pelvic Pressure   Abdominal Pain   Virginia Bennett , a  28 y.o. G2P1001 at [redacted]w[redacted]d presents to MAU with complaints of blurred vision. Patient states that she had one episode of blurry vision on Friday and it spontaneously resolved and had no other issues. Reports that today she woke up with blurred vision,  to the point she could not make out the words on her phone. She states that she has more recently also had some headaches, that resolve with sleep and rest. Reports headache mostly on the left and states that they are accompanied by the blurred vision. When the headache goes away so does the visual disturbances. She denies attempting to relieve symptoms. She also reports LLQ pain that she reports as a 9/10 yesterday. She denies attempting to relieve symptoms. Describes pain as sharp and intense, worsened by movement. She denies epigastric pain, elevated BPs or increased swelling. Denies vaginal bleeding, leaking of fluid and contractions. Endorses positive fetal movement.           OB History     Gravida  2   Para  1   Term  1   Preterm      AB      Living  1      SAB      IAB      Ectopic      Multiple  0   Live Births  1           Past Medical History:  Diagnosis Date   ADHD (attention deficit hyperactivity disorder)    Adjustment disorder    Alpha thalassemia silent carrier    GERD (gastroesophageal reflux disease)    Learning disability    history of IEP beginning in 5th grade   MDD (major depressive disorder), recurrent episode (HCC) 03/27/2017   Right eye injury 06/2018   laceration   Vitamin D  deficiency disease     Past Surgical History:  Procedure Laterality Date   FOOT SURGERY Bilateral 2017   Reports had a bond taken out of  both feet to correct fallen arches    Family History  Problem Relation Age of Onset   Diabetes Father    Asthma Sister    Asthma Sister    Schizophrenia Brother    Anxiety disorder Cousin    Depression Cousin    Schizophrenia Cousin     Social History   Tobacco Use   Smoking status: Never   Smokeless tobacco: Never  Vaping Use   Vaping status: Never Used  Substance Use Topics   Alcohol use: No    Alcohol/week: 0.0 standard drinks of alcohol   Drug use: Not Currently    Frequency: 21.0 times per week    Types: Marijuana    Comment: last used 10/31/2018    Allergies:  Allergies  Allergen Reactions   Aspirin Anaphylaxis    Father & Siblings are allergic. So Mom doesn't even give to patient.    No medications prior to admission.    Review of Systems  Constitutional:  Negative for chills, fatigue and fever.  Eyes:  Positive for visual disturbance. Negative for pain.  Respiratory:  Negative for apnea, shortness of breath and wheezing.   Cardiovascular:  Negative for chest pain and palpitations.  Gastrointestinal:  Positive for abdominal pain. Negative for constipation, diarrhea, nausea and vomiting.  Genitourinary:  Positive for pelvic pain. Negative for difficulty urinating, dysuria, vaginal bleeding, vaginal discharge and vaginal pain.  Musculoskeletal:  Negative for back pain.  Neurological:  Negative for seizures, weakness and headaches.  Psychiatric/Behavioral:  Negative for suicidal ideas.    Physical Exam   Blood pressure 120/70, pulse 92, temperature 98.5 F (36.9 C), temperature source Oral, resp. rate 18, height 5' 4 (1.626 m), weight 85.9 kg, last menstrual period 01/21/2023, SpO2 100%, currently breastfeeding.  Physical Exam Vitals and nursing note reviewed.  Constitutional:      General: She is not in acute distress.    Appearance: Normal appearance.  HENT:     Head: Normocephalic.  Cardiovascular:     Rate and Rhythm: Normal rate.  Pulmonary:      Effort: Pulmonary effort is normal.     Breath sounds: Normal breath sounds.  Abdominal:     Tenderness: There is abdominal tenderness in the left upper quadrant and left lower quadrant.  Musculoskeletal:     Cervical back: Normal range of motion.  Skin:    General: Skin is warm and dry.  Neurological:     Mental Status: She is alert and oriented to person, place, and time.  Psychiatric:        Mood and Affect: Mood normal.    FHT: 150 bpm with moderate variaiblity. 10x10 Accels present no decels  Toco: irregular    Patient Vitals for the past 24 hrs:  BP Temp Temp src Pulse Resp SpO2 Height Weight  08/31/23 1342 120/70 -- -- 92 -- -- -- --  08/31/23 1324 116/68 98.5 F (36.9 C) Oral 74 18 100 % -- --  08/31/23 1316 -- -- -- -- -- -- 5' 4 (1.626 m) 85.9 kg    MAU Course  Procedures Orders Placed This Encounter  Procedures   Wet prep, genital   US  MFM Fetal BPP Wo Non Stress   Urinalysis, Routine w reflex microscopic -Urine, Clean Catch   CBC   Comprehensive metabolic panel with GFR   Protein / creatinine ratio, urine   Discharge patient Discharge disposition: 01-Home or Self Care; Discharge patient date: 08/31/2023   Results for orders placed or performed during the hospital encounter of 08/31/23 (from the past 24 hours)  CBC     Status: Abnormal   Collection Time: 08/31/23  2:01 PM  Result Value Ref Range   WBC 6.2 4.0 - 10.5 K/uL   RBC 3.77 (L) 3.87 - 5.11 MIL/uL   Hemoglobin 11.0 (L) 12.0 - 15.0 g/dL   HCT 66.5 (L) 63.9 - 53.9 %   MCV 88.6 80.0 - 100.0 fL   MCH 29.2 26.0 - 34.0 pg   MCHC 32.9 30.0 - 36.0 g/dL   RDW 85.4 88.4 - 84.4 %   Platelets 268 150 - 400 K/uL   nRBC 0.0 0.0 - 0.2 %  Comprehensive metabolic panel with GFR     Status: Abnormal   Collection Time: 08/31/23  2:01 PM  Result Value Ref Range   Sodium 137 135 - 145 mmol/L   Potassium 3.4 (L) 3.5 - 5.1 mmol/L   Chloride 110 98 - 111 mmol/L   CO2 18 (L) 22 - 32 mmol/L   Glucose, Bld 74 70  - 99 mg/dL   BUN <5 (L) 6 - 20 mg/dL   Creatinine, Ser 9.39 0.44 - 1.00 mg/dL  Calcium 8.2 (L) 8.9 - 10.3 mg/dL   Total Protein 5.5 (L) 6.5 - 8.1 g/dL   Albumin 2.6 (L) 3.5 - 5.0 g/dL   AST 18 15 - 41 U/L   ALT 14 0 - 44 U/L   Alkaline Phosphatase 108 38 - 126 U/L   Total Bilirubin 0.4 0.0 - 1.2 mg/dL   GFR, Estimated >39 >39 mL/min   Anion gap 9 5 - 15  Urinalysis, Routine w reflex microscopic -Urine, Clean Catch     Status: Abnormal   Collection Time: 08/31/23  2:36 PM  Result Value Ref Range   Color, Urine YELLOW YELLOW   APPearance HAZY (A) CLEAR   Specific Gravity, Urine 1.023 1.005 - 1.030   pH 6.0 5.0 - 8.0   Glucose, UA NEGATIVE NEGATIVE mg/dL   Hgb urine dipstick NEGATIVE NEGATIVE   Bilirubin Urine NEGATIVE NEGATIVE   Ketones, ur 80 (A) NEGATIVE mg/dL   Protein, ur NEGATIVE NEGATIVE mg/dL   Nitrite NEGATIVE NEGATIVE   Leukocytes,Ua NEGATIVE NEGATIVE  Protein / creatinine ratio, urine     Status: None   Collection Time: 08/31/23  2:36 PM  Result Value Ref Range   Creatinine, Urine 308 mg/dL   Total Protein, Urine 16 mg/dL   Protein Creatinine Ratio 0.05 0.00 - 0.15 mg/mg[Cre]  Wet prep, genital     Status: None   Collection Time: 08/31/23  3:07 PM  Result Value Ref Range   Yeast Wet Prep HPF POC NONE SEEN NONE SEEN   Trich, Wet Prep NONE SEEN NONE SEEN   Clue Cells Wet Prep HPF POC NONE SEEN NONE SEEN   WBC, Wet Prep HPF POC <10 <10   Sperm NONE SEEN     MDM - BPs normal. Low suspicion for PreE  - 10x10s, however at 32 weeks expect 15x15 Patient sent for BPP  - PreE labs normal. Low suspicion for PreE - Tylenol  and flexeril  sent to outpatient pharmacy.  - Pain improved with PO meds  - Consulted Dr. Cleatus on patient FHT. Only 10x10 noted at [redacted] weeks gestation. Per MD Order a BPP if normal patient may be discharged home  - BPP 8/8 plan for discharge   Assessment and Plan   1. Blurred vision   2. [redacted] weeks gestation of pregnancy   3. Non-reactive NST  (non-stress test)   4. Abdominal pain, unspecified abdominal location   5. Atypical migraine    - Reviewed that pain and blurred vision are likely related to atypical migraines in pregnancy.  - Reviewed worsening signs and return precautions.  - Reviewed fetal kick counts for 3rd trimester - FHT appropriate for gestational age at time of discharge.  - Patient discharged home in stable condition and amy return to MAU as needed.   Claris CHRISTELLA Cedar, MSN CNM  08/31/2023, 6:06 PM

## 2023-09-01 LAB — GC/CHLAMYDIA PROBE AMP (~~LOC~~) NOT AT ARMC
Chlamydia: NEGATIVE
Comment: NEGATIVE
Comment: NORMAL
Neisseria Gonorrhea: NEGATIVE

## 2023-10-02 LAB — OB RESULTS CONSOLE GBS: GBS: NEGATIVE

## 2023-10-27 ENCOUNTER — Encounter (HOSPITAL_COMMUNITY): Payer: Self-pay | Admitting: Obstetrics and Gynecology

## 2023-10-27 ENCOUNTER — Other Ambulatory Visit: Payer: Self-pay

## 2023-10-27 ENCOUNTER — Inpatient Hospital Stay (HOSPITAL_BASED_OUTPATIENT_CLINIC_OR_DEPARTMENT_OTHER)

## 2023-10-27 ENCOUNTER — Inpatient Hospital Stay (HOSPITAL_COMMUNITY)
Admission: AD | Admit: 2023-10-27 | Discharge: 2023-10-29 | DRG: 807 | Disposition: A | Discharge status: Home / Self Care | Attending: Obstetrics and Gynecology | Admitting: Obstetrics and Gynecology

## 2023-10-27 DIAGNOSIS — O368130 Decreased fetal movements, third trimester, not applicable or unspecified: Secondary | ICD-10-CM | POA: Diagnosis not present

## 2023-10-27 DIAGNOSIS — O35BXX0 Maternal care for other (suspected) fetal abnormality and damage, fetal cardiac anomalies, not applicable or unspecified: Secondary | ICD-10-CM | POA: Diagnosis present

## 2023-10-27 DIAGNOSIS — O358XX0 Maternal care for other (suspected) fetal abnormality and damage, not applicable or unspecified: Secondary | ICD-10-CM | POA: Diagnosis not present

## 2023-10-27 DIAGNOSIS — Z3A40 40 weeks gestation of pregnancy: Secondary | ICD-10-CM | POA: Diagnosis not present

## 2023-10-27 DIAGNOSIS — O480 Post-term pregnancy: Secondary | ICD-10-CM | POA: Diagnosis not present

## 2023-10-27 DIAGNOSIS — G44209 Tension-type headache, unspecified, not intractable: Secondary | ICD-10-CM

## 2023-10-27 DIAGNOSIS — O479 False labor, unspecified: Principal | ICD-10-CM

## 2023-10-27 DIAGNOSIS — O26893 Other specified pregnancy related conditions, third trimester: Secondary | ICD-10-CM | POA: Diagnosis present

## 2023-10-27 DIAGNOSIS — Z23 Encounter for immunization: Secondary | ICD-10-CM | POA: Diagnosis not present

## 2023-10-27 DIAGNOSIS — Z0371 Encounter for suspected problem with amniotic cavity and membrane ruled out: Secondary | ICD-10-CM

## 2023-10-27 DIAGNOSIS — Z148 Genetic carrier of other disease: Secondary | ICD-10-CM | POA: Diagnosis not present

## 2023-10-27 DIAGNOSIS — O368190 Decreased fetal movements, unspecified trimester, not applicable or unspecified: Secondary | ICD-10-CM | POA: Diagnosis present

## 2023-10-27 DIAGNOSIS — Z3689 Encounter for other specified antenatal screening: Secondary | ICD-10-CM

## 2023-10-27 LAB — CBC
HCT: 38.1 % (ref 36.0–46.0)
Hemoglobin: 12.8 g/dL (ref 12.0–15.0)
MCH: 29.3 pg (ref 26.0–34.0)
MCHC: 33.6 g/dL (ref 30.0–36.0)
MCV: 87.2 fL (ref 80.0–100.0)
Platelets: 329 K/uL (ref 150–400)
RBC: 4.37 MIL/uL (ref 3.87–5.11)
RDW: 12.6 % (ref 11.5–15.5)
WBC: 8 K/uL (ref 4.0–10.5)
nRBC: 0 % (ref 0.0–0.2)

## 2023-10-27 LAB — URINALYSIS, ROUTINE W REFLEX MICROSCOPIC
Glucose, UA: NEGATIVE mg/dL
Hgb urine dipstick: NEGATIVE
Ketones, ur: NEGATIVE mg/dL
Nitrite: NEGATIVE
Protein, ur: 100 mg/dL — AB
Specific Gravity, Urine: 1.034 — ABNORMAL HIGH (ref 1.005–1.030)
pH: 5 (ref 5.0–8.0)

## 2023-10-27 LAB — RUPTURE OF MEMBRANE (ROM)PLUS: Rom Plus: NEGATIVE

## 2023-10-27 LAB — POCT FERN TEST: POCT Fern Test: NEGATIVE

## 2023-10-27 LAB — TYPE AND SCREEN
ABO/RH(D): A POS
Antibody Screen: NEGATIVE

## 2023-10-27 MED ORDER — SODIUM CHLORIDE 0.9 % IV SOLN
12.5000 mg | Freq: Four times a day (QID) | INTRAVENOUS | Status: DC | PRN
  Filled 2023-10-27: qty 0.5

## 2023-10-27 MED ORDER — LIDOCAINE HCL (PF) 1 % IJ SOLN: 30.0000 mL | INTRAMUSCULAR | Status: DC | PRN

## 2023-10-27 MED ORDER — SOD CITRATE-CITRIC ACID 500-334 MG/5ML PO SOLN: 30.0000 mL | ORAL | Status: DC | PRN

## 2023-10-27 MED ORDER — CYCLOBENZAPRINE HCL 5 MG PO TABS
10.0000 mg | ORAL_TABLET | Freq: Once | ORAL | Status: AC
  Administered 2023-10-27: 10 mg via ORAL
  Filled 2023-10-27: qty 2

## 2023-10-27 MED ORDER — ONDANSETRON HCL 4 MG/2ML IJ SOLN: 4.0000 mg | Freq: Four times a day (QID) | INTRAMUSCULAR | Status: DC | PRN

## 2023-10-27 MED ORDER — TERBUTALINE SULFATE 1 MG/ML IJ SOLN: 0.2500 mg | Freq: Once | INTRAMUSCULAR | Status: DC | PRN

## 2023-10-27 MED ORDER — OXYTOCIN-SODIUM CHLORIDE 30-0.9 UT/500ML-% IV SOLN
1.0000 m[IU]/min | INTRAVENOUS | Status: DC
  Administered 2023-10-27: 2 m[IU]/min via INTRAVENOUS
  Filled 2023-10-27: qty 500

## 2023-10-27 MED ORDER — ACETAMINOPHEN-CAFFEINE 500-65 MG PO TABS
2.0000 | ORAL_TABLET | Freq: Once | ORAL | Status: AC
  Administered 2023-10-27: 2 via ORAL
  Filled 2023-10-27: qty 2

## 2023-10-27 MED ORDER — LACTATED RINGERS IV SOLN: INTRAVENOUS | Status: DC

## 2023-10-27 MED ORDER — METOCLOPRAMIDE HCL 10 MG PO TABS
10.0000 mg | ORAL_TABLET | Freq: Once | ORAL | Status: AC
  Administered 2023-10-27: 10 mg via ORAL
  Filled 2023-10-27: qty 1

## 2023-10-27 MED ORDER — ZOLPIDEM TARTRATE 5 MG PO TABS
5.0000 mg | ORAL_TABLET | Freq: Every evening | ORAL | Status: DC | PRN
  Administered 2023-10-27: 5 mg via ORAL
  Filled 2023-10-27: qty 1

## 2023-10-27 MED ORDER — ACETAMINOPHEN 325 MG PO TABS
650.0000 mg | ORAL_TABLET | ORAL | Status: DC | PRN
  Administered 2023-10-28: 650 mg via ORAL
  Filled 2023-10-27: qty 2

## 2023-10-27 MED ORDER — OXYTOCIN BOLUS FROM INFUSION
333.0000 mL | Freq: Once | INTRAVENOUS | Status: AC
  Administered 2023-10-28: 333 mL via INTRAVENOUS

## 2023-10-27 MED ORDER — FLEET ENEMA RE ENEM: 1.0000 | ENEMA | RECTAL | Status: DC | PRN

## 2023-10-27 MED ORDER — OXYCODONE-ACETAMINOPHEN 5-325 MG PO TABS: 1.0000 | ORAL_TABLET | ORAL | Status: DC | PRN

## 2023-10-27 MED ORDER — OXYTOCIN-SODIUM CHLORIDE 30-0.9 UT/500ML-% IV SOLN: 2.5000 [IU]/h | INTRAVENOUS | Status: DC

## 2023-10-27 MED ORDER — LACTATED RINGERS IV SOLN
500.0000 mL | INTRAVENOUS | Status: DC | PRN
  Administered 2023-10-28 (×2): 500 mL via INTRAVENOUS

## 2023-10-27 MED ORDER — OXYCODONE-ACETAMINOPHEN 5-325 MG PO TABS: 2.0000 | ORAL_TABLET | ORAL | Status: DC | PRN

## 2023-10-27 MED ORDER — FENTANYL CITRATE (PF) 100 MCG/2ML IJ SOLN
100.0000 ug | INTRAMUSCULAR | Status: DC | PRN
  Administered 2023-10-28: 100 ug via INTRAVENOUS
  Filled 2023-10-27: qty 2

## 2023-10-27 NOTE — MAU Provider Note (Signed)
 History     CSN: [id number]  Arrival date and time: 10/27/23 1506   Event Date/Time   First Provider Initiated Contact with Patient 10/27/23 1620      Chief Complaint  Patient presents with   Decreased Fetal Movement   Rupture of Membranes   Headache   28 y.o. G2P1001 [redacted]w[redacted]d here with complaints of leaking fluid, .  She reports that she had a large gush of fluid when moving from sitting to standing then had no fluid leaking and then had a gush of fluid again.  She reports since the second gush she has had minimal leaking of fluid.  She also reports perceived decreased fetal movement she had not tried drinking water but she did eat sour patch kids prior to arrival here in the MAU.  During our interview the patient noted her baby was moving and reactive to fetal movement visibly while I was in there.  She reports she has also had a headache for the last 12 hours.  She does report she had initiation of her headache probably sometime  +FM, denies LOF, VB, contractions, vaginal discharge.  Headache  Pertinent negatives include no abdominal pain, back pain, coughing, dizziness, eye pain, fever, nausea, sore throat or vomiting.    OB History     Gravida  2   Para  1   Term  1   Preterm      AB      Living  1      SAB      IAB      Ectopic      Multiple  0   Live Births  1           Past Medical History:  Diagnosis Date   ADHD (attention deficit hyperactivity disorder)    Adjustment disorder    Alpha thalassemia silent carrier    GERD (gastroesophageal reflux disease)    Learning disability    history of IEP beginning in 5th grade   MDD (major depressive disorder), recurrent episode 03/27/2017   Right eye injury 06/2018   laceration   Vitamin D  deficiency disease     Past Surgical History:  Procedure Laterality Date   FOOT SURGERY Bilateral 2017   Reports had a bond taken out of both feet to correct fallen arches    Family History  Problem Relation  Age of Onset   Diabetes Father    Asthma Sister    Asthma Sister    Schizophrenia Brother    Anxiety disorder Cousin    Depression Cousin    Schizophrenia Cousin     Social History   Tobacco Use   Smoking status: Never   Smokeless tobacco: Never  Vaping Use   Vaping status: Never Used  Substance Use Topics   Alcohol use: No    Alcohol/week: 0.0 standard drinks of alcohol   Drug use: Not Currently    Types: Marijuana    Allergies:  Allergies  Allergen Reactions   Aspirin Anaphylaxis    Father & Siblings are allergic. So Mom doesn't even give to patient.    Medications Prior to Admission  Medication Sig Dispense Refill Last Dose/Taking   acetaminophen  (TYLENOL ) 325 MG tablet Take 2 tablets (650 mg total) by mouth every 4 (four) hours as needed (for pain scale < 4).   10/27/2023 at 12:30 PM   Prenatal Vit-Fe Phos-FA-Omega (VITAFOL  GUMMIES) 3.33-0.333-34.8 MG CHEW Chew 3 tablets by mouth daily. 90 tablet 3 10/27/2023  Review of Systems  Constitutional:  Negative for chills and fever.  HENT:  Negative for congestion and sore throat.   Eyes:  Negative for pain and visual disturbance.  Respiratory:  Negative for cough, chest tightness and shortness of breath.   Cardiovascular:  Negative for chest pain.  Gastrointestinal:  Negative for abdominal pain, diarrhea, nausea and vomiting.  Endocrine: Negative for cold intolerance and heat intolerance.  Genitourinary:  Negative for dysuria and flank pain.  Musculoskeletal:  Negative for back pain.  Skin:  Negative for rash.  Allergic/Immunologic: Negative for food allergies.  Neurological:  Positive for headaches. Negative for dizziness and light-headedness.  Psychiatric/Behavioral:  Negative for agitation.    Physical Exam   Blood pressure 129/88, pulse 85, temperature 98.3 F (36.8 C), temperature source Oral, resp. rate 18, height 5' 4 (1.626 m), weight 87.7 kg, last menstrual period 01/21/2023, SpO2 100%, currently  breastfeeding.  Physical Exam Vitals and nursing note reviewed. Exam conducted with a chaperone present.  Constitutional:      General: She is not in acute distress.    Appearance: She is well-developed.     Comments: Pregnant female  HENT:     Head: Normocephalic and atraumatic.  Eyes:     General: No scleral icterus.    Conjunctiva/sclera: Conjunctivae normal.  Cardiovascular:     Rate and Rhythm: Normal rate.  Pulmonary:     Effort: Pulmonary effort is normal.  Chest:     Chest wall: No tenderness.  Abdominal:     Palpations: Abdomen is soft.     Tenderness: There is no abdominal tenderness. There is no guarding or rebound.     Comments: Gravid  Genitourinary:    General: Normal vulva.     Exam position: Lithotomy position.     Vagina: Normal. No bleeding or lesions.     Cervix: Normal. No cervical bleeding.     Uterus: Normal. Enlarged.   Musculoskeletal:        General: Normal range of motion.     Cervical back: Normal range of motion and neck supple.  Skin:    General: Skin is warm and dry.     Findings: No rash.  Neurological:     Mental Status: She is alert and oriented to person, place, and time.   Initial:  Dilation: 3 Effacement (%): 50 Cervical Position: Middle Station: -3 Presentation: Vertex Exam by:: Suzen Masters MD   MAU Course  Procedures  I reviewed the patient's fetal monitoring.  Baseline HR: 130 Variability:  moderate Accels:present- multiple and also noted fetal movement by patient, machine and palpation Decels: none  A/P: Reactive NST  Reassured regarding fetal status.    MDM- HIGH  Results for orders placed or performed during the hospital encounter of 10/27/23 (from the past 24 hours)  Fern Test     Status: None   Collection Time: 10/27/23  4:00 PM  Result Value Ref Range   POCT Fern Test Negative = intact amniotic membranes   Rupture of Membrane (ROM) Plus     Status: None   Collection Time: 10/27/23  4:01 PM  Result Value  Ref Range   Rom Plus NEGATIVE   Urinalysis, Routine w reflex microscopic -Urine, Clean Catch     Status: Abnormal   Collection Time: 10/27/23  4:48 PM  Result Value Ref Range   Color, Urine AMBER (A) YELLOW   APPearance CLOUDY (A) CLEAR   Specific Gravity, Urine 1.034 (H) 1.005 - 1.030   pH 5.0  5.0 - 8.0   Glucose, UA NEGATIVE NEGATIVE mg/dL   Hgb urine dipstick NEGATIVE NEGATIVE   Bilirubin Urine SMALL (A) NEGATIVE   Ketones, ur NEGATIVE NEGATIVE mg/dL   Protein, ur 899 (A) NEGATIVE mg/dL   Nitrite NEGATIVE NEGATIVE   Leukocytes,Ua SMALL (A) NEGATIVE   RBC / HPF 0-5 0 - 5 RBC/hpf   WBC, UA 6-10 0 - 5 WBC/hpf   Bacteria, UA MANY (A) NONE SEEN   Squamous Epithelial / HPF 0-5 0 - 5 /HPF   Mucus PRESENT    Non Squamous Epithelial 0-5 (A) NONE SEEN   5:30 PM HA is not improved with Excedrin and flexeril . Will try reglan. BPs remain WNL  6:44 PM HA still present after Reglan. BPP was 4/8, with NST 6/10 but at term discussed recommendation for delivery. Additional US  showed what appears to be a likely new 2.4 x 2.2cm cyst under right kidney. Informed patient of this finding. Baby will need postnatal assessment  I filled out part of prenatal transfer tool flowsheet to reflect the cyst that was found today   Assessment and Plan   1. Uterine contractions   2. Encounter for suspected PROM, with rupture of membranes not found   3. Acute non intractable tension-type headache   4. [redacted] weeks gestation of pregnancy   5. NST (non-stress test) reactive     Admit to LD for decreased FM with 4/8 BPP Called Dr. Armond who assumes care I have placed the Admit to LD order and labs ordered to help expedite process   Suzen Maryan Masters 10/27/2023, 6:44 PM

## 2023-10-27 NOTE — H&P (Signed)
 Virginia Bennett is a 28 y.o. female presenting for IOL for BPP 6/10 . Pt presented for LOF and decreased FM.  BPP 6/10 pt did not break her water on exam.  She had a headache but no blurred vision or RUQ pain. OB History     Gravida  2   Para  1   Term  1   Preterm      AB      Living  1      SAB      IAB      Ectopic      Multiple  0   Live Births  1          Past Medical History:  Diagnosis Date   ADHD (attention deficit hyperactivity disorder)    Adjustment disorder    Alpha thalassemia silent carrier    GERD (gastroesophageal reflux disease)    Learning disability    history of IEP beginning in 5th grade   MDD (major depressive disorder), recurrent episode 03/27/2017   Right eye injury 06/2018   laceration   Vitamin D  deficiency disease    Past Surgical History:  Procedure Laterality Date   FOOT SURGERY Bilateral 2017   Reports had a bond taken out of both feet to correct fallen arches   Family History: family history includes Anxiety disorder in her cousin; Asthma in her sister and sister; Depression in her cousin; Diabetes in her father; Schizophrenia in her brother and cousin. Social History:  reports that she has never smoked. She has never used smokeless tobacco. She reports that she does not currently use drugs after having used the following drugs: Marijuana. She reports that she does not drink alcohol.     Maternal Diabetes: No Genetic Screening: Normal Maternal Ultrasounds/Referrals: Normal Fetal Ultrasounds or other Referrals:  None Maternal Substance Abuse:  No Significant Maternal Medications:  None Significant Maternal Lab Results:  Group B Strep negative Number of Prenatal Visits:greater than 3 verified prenatal visits Maternal Vaccinations: Other Comments:  None  Review of Systems History Dilation: 3 Effacement (%): 50 Station: -3 Exam by:: Rollo Crane, RN Blood pressure 108/64, pulse 80, temperature 98.1 F (36.7 C),  temperature source Oral, resp. rate 18, height 5' 4 (1.626 m), weight 87.7 kg, last menstrual period 01/21/2023, SpO2 100%, currently breastfeeding. Exam Physical Exam  Physical Examination: General appearance - alert, well appearing, and in no distress Abdomen - soft, nontender, nondistended, no masses or organomegaly gravid Extremities - Homan's sign negative bilaterally Skin - normal coloration and turgor, no rashes, no suspicious skin lesions noted  Prenatal labs: ABO, Rh: --/--/A POS (10/07 1904) Antibody: NEG (10/07 1904) Rubella: Immune (03/20 0000) RPR:    HBsAg: Negative (03/20 0000)  HIV: Non-reactive (07/09 0000)  GBS: Negative/-- (09/12 0000)   Assessment/Plan: IUP at 40 an 1/7 weeks  BPP 6/10 Cat 1 traing Toco irreg Start pitocin  induction Pain management per pt request GBS neg  Anticipate SVD   Virginia Bennett A Virginia Bennett 10/27/2023, 9:59 PM

## 2023-10-27 NOTE — MAU Note (Addendum)
 Virginia Bennett is a 28 y.o. at [redacted]w[redacted]d here in MAU reporting: laid down after taking a shower because she felt dizzy, when she got up she felt a gush. Has had a little trickling since but has not needed a pad. Also complaining of a really bad headache since yesterday, that has gotten worse today. With the headache, she has noted visual changes in her left eye, states it's blurry and she feels like there are stars there. Took tylenol  at 1230. States hasn't felt baby move since 1230. Denies VB and UCs.    Pain score: 7/10 head Vitals:   10/27/23 1535  BP: 122/83  Pulse: (!) 106  Resp: 18  Temp: 98.3 F (36.8 C)  SpO2: 100%     FHT: 140  Lab orders placed from triage: [place], UA

## 2023-10-28 ENCOUNTER — Encounter (HOSPITAL_COMMUNITY): Payer: Self-pay | Admitting: Anesthesiology

## 2023-10-28 ENCOUNTER — Encounter (HOSPITAL_COMMUNITY): Payer: Self-pay | Admitting: Obstetrics and Gynecology

## 2023-10-28 LAB — SYPHILIS: RPR W/REFLEX TO RPR TITER AND TREPONEMAL ANTIBODIES, TRADITIONAL SCREENING AND DIAGNOSIS ALGORITHM: RPR Ser Ql: NONREACTIVE

## 2023-10-28 MED ORDER — PHENYLEPHRINE 80 MCG/ML (10ML) SYRINGE FOR IV PUSH (FOR BLOOD PRESSURE SUPPORT): 80.0000 ug | PREFILLED_SYRINGE | INTRAVENOUS | Status: DC | PRN

## 2023-10-28 MED ORDER — ONDANSETRON HCL 4 MG PO TABS: 4.0000 mg | ORAL_TABLET | ORAL | Status: DC | PRN

## 2023-10-28 MED ORDER — EPHEDRINE 5 MG/ML INJ: 10.0000 mg | INTRAVENOUS | Status: DC | PRN

## 2023-10-28 MED ORDER — ONDANSETRON HCL 4 MG/2ML IJ SOLN: 4.0000 mg | INTRAMUSCULAR | Status: DC | PRN

## 2023-10-28 MED ORDER — SENNOSIDES-DOCUSATE SODIUM 8.6-50 MG PO TABS
2.0000 | ORAL_TABLET | Freq: Every day | ORAL | Status: DC
  Administered 2023-10-29: 2 via ORAL
  Filled 2023-10-28: qty 2

## 2023-10-28 MED ORDER — SIMETHICONE 80 MG PO CHEW: 80.0000 mg | CHEWABLE_TABLET | ORAL | Status: DC | PRN

## 2023-10-28 MED ORDER — BENZOCAINE-MENTHOL 20-0.5 % EX AERO
1.0000 | INHALATION_SPRAY | CUTANEOUS | Status: DC | PRN
  Administered 2023-10-29: 1 via TOPICAL
  Filled 2023-10-28: qty 56

## 2023-10-28 MED ORDER — DIBUCAINE (PERIANAL) 1 % EX OINT: 1.0000 | TOPICAL_OINTMENT | CUTANEOUS | Status: DC | PRN

## 2023-10-28 MED ORDER — DIPHENHYDRAMINE HCL 25 MG PO CAPS: 25.0000 mg | ORAL_CAPSULE | Freq: Four times a day (QID) | ORAL | Status: DC | PRN

## 2023-10-28 MED ORDER — PRENATAL MULTIVITAMIN CH
1.0000 | ORAL_TABLET | Freq: Every day | ORAL | Status: DC
  Administered 2023-10-28 – 2023-10-29 (×2): 1 via ORAL
  Filled 2023-10-28 (×2): qty 1

## 2023-10-28 MED ORDER — OXYCODONE HCL 5 MG PO TABS
5.0000 mg | ORAL_TABLET | ORAL | Status: DC | PRN
  Filled 2023-10-28: qty 1

## 2023-10-28 MED ORDER — ZOLPIDEM TARTRATE 5 MG PO TABS: 5.0000 mg | ORAL_TABLET | Freq: Every evening | ORAL | Status: DC | PRN

## 2023-10-28 MED ORDER — ACETAMINOPHEN 325 MG PO TABS
650.0000 mg | ORAL_TABLET | ORAL | Status: DC | PRN
  Administered 2023-10-28 – 2023-10-29 (×5): 650 mg via ORAL
  Filled 2023-10-28 (×5): qty 2

## 2023-10-28 MED ORDER — TETANUS-DIPHTH-ACELL PERTUSSIS 5-2-15.5 LF-MCG/0.5 IM SUSP
0.5000 mL | Freq: Once | INTRAMUSCULAR | Status: AC
  Administered 2023-10-29: 0.5 mL via INTRAMUSCULAR
  Filled 2023-10-28: qty 0.5

## 2023-10-28 MED ORDER — OXYCODONE HCL 5 MG PO TABS
5.0000 mg | ORAL_TABLET | ORAL | Status: DC | PRN
  Administered 2023-10-28 – 2023-10-29 (×4): 5 mg via ORAL
  Filled 2023-10-28 (×3): qty 1

## 2023-10-28 MED ORDER — TRANEXAMIC ACID-NACL 1000-0.7 MG/100ML-% IV SOLN
INTRAVENOUS | Status: AC
  Administered 2023-10-28: 1000 mg via INTRAVENOUS
  Filled 2023-10-28: qty 100

## 2023-10-28 MED ORDER — WITCH HAZEL-GLYCERIN EX PADS: 1.0000 | MEDICATED_PAD | CUTANEOUS | Status: DC | PRN

## 2023-10-28 MED ORDER — FENTANYL-BUPIVACAINE-NACL 0.5-0.125-0.9 MG/250ML-% EP SOLN
EPIDURAL | Status: AC
  Filled 2023-10-28: qty 250

## 2023-10-28 MED ORDER — OXYCODONE HCL 5 MG PO TABS: 10.0000 mg | ORAL_TABLET | ORAL | Status: DC | PRN

## 2023-10-28 MED ORDER — TRANEXAMIC ACID-NACL 1000-0.7 MG/100ML-% IV SOLN: 1000.0000 mg | INTRAVENOUS | Status: AC

## 2023-10-28 MED ORDER — LACTATED RINGERS IV SOLN: 500.0000 mL | Freq: Once | INTRAVENOUS | Status: DC

## 2023-10-28 MED ORDER — FENTANYL-BUPIVACAINE-NACL 0.5-0.125-0.9 MG/250ML-% EP SOLN: 12.0000 mL/h | EPIDURAL | Status: DC | PRN

## 2023-10-28 MED ORDER — COCONUT OIL OIL: 1.0000 | TOPICAL_OIL | Status: DC | PRN

## 2023-10-28 MED ORDER — DIPHENHYDRAMINE HCL 50 MG/ML IJ SOLN: 12.5000 mg | INTRAMUSCULAR | Status: DC | PRN

## 2023-10-28 MED ORDER — INFLUENZA VIRUS VACC SPLIT PF (FLUZONE) 0.5 ML IM SUSY
0.5000 mL | PREFILLED_SYRINGE | INTRAMUSCULAR | Status: AC
  Administered 2023-10-29: 0.5 mL via INTRAMUSCULAR
  Filled 2023-10-28: qty 0.5

## 2023-10-28 NOTE — Anesthesia Preprocedure Evaluation (Addendum)
 Anesthesia Evaluation  Patient identified by MRN, date of birth, ID band Patient awake    Reviewed: Allergy & Precautions, H&P , NPO status , Patient's Chart, lab work & pertinent test results  Airway Mallampati: II  TM Distance: >3 FB Neck ROM: Full    Dental no notable dental hx. (+) Teeth Intact   Pulmonary neg pulmonary ROS   Pulmonary exam normal breath sounds clear to auscultation       Cardiovascular Exercise Tolerance: Good negative cardio ROS Normal cardiovascular exam Rhythm:Regular Rate:Normal     Neuro/Psych    Depression    negative neurological ROS  negative psych ROS   GI/Hepatic negative GI ROS, Neg liver ROS,,,  Endo/Other  negative endocrine ROS    Renal/GU negative Renal ROS  negative genitourinary   Musculoskeletal negative musculoskeletal ROS (+)    Abdominal   Peds negative pediatric ROS (+)  Hematology negative hematology ROS (+) hgb 13.1 Plt 302   Anesthesia Other Findings   Reproductive/Obstetrics negative OB ROS                              Anesthesia Physical Anesthesia Plan  ASA: II  Anesthesia Plan: Epidural   Post-op Pain Management:    Induction:   PONV Risk Score and Plan:   Airway Management Planned:   Additional Equipment:   Intra-op Plan:   Post-operative Plan:   Informed Consent: I have reviewed the patients History and Physical, chart, labs and discussed the procedure including the risks, benefits and alternatives for the proposed anesthesia with the patient or authorized representative who has indicated his/her understanding and acceptance.       Plan Discussed with:   Anesthesia Plan Comments: (38 wk G1P0 for LEA)         Anesthesia Quick Evaluation

## 2023-10-28 NOTE — Progress Notes (Signed)
 Almetta Brine [id number]  Nurse calls provider, at [phone], stating patient complete dilation and pushing involuntarily.  States primary ob en route. Provider to bedside ~3 minutes later and patient continues to push.  At 31 female infant Forensic scientist) was delivered vaginally via ROA position. Infant with good tone and spontaneous cry. APGAR: 8, 9. Cord cut after 3 minutes by FOB-Maurice.  At [phone] Dr. GEANNIE All arrived at bedside and care was relinquished.  Placenta remained in utero upon this provider exit.    Harlene LITTIE Duncans MSN, CNM Advanced Practice Provider, [facility] 10/28/2023, 4:57 AM

## 2023-10-28 NOTE — Plan of Care (Signed)

## 2023-10-28 NOTE — Lactation Note (Addendum)
 This note was copied from a baby's chart. Lactation Consultation Note  Patient Name: Virginia Bennett Date: 10/28/2023 Age:26 hours Reason for consult: Follow-up assessment;Term  P2, 40 wks, @ 6 hrs of life. Mom feeding breast and formula by choice. Discussed stomach size- 10 ml on first day is plenty. Cross-cradle to right breast demonstrated for mom. Colostrum easily expressed- whitish in color- running. Praised mom- not all moms have this flow- her milk is enough for baby. Hand pump provided- sized to 21 mm flanges. Discussed sizing dynamic with breast changes- size variety provided. Demonstrated starting with hand expression and breast compression. After a couple latches baby/ mom take over well together. Encouraged feeding every 3 hours/ or with feeding cues.  Discussed expectations @ breast- Day 1- sleepy/ feed every 3 hrs/ even 10 minutes is okay, Day 2 more awake/ feeding cues/longer feeds, and cluster feeding overnights brings milk in. Highlighted breast stimulation is tied directly to milk production. Discussed hands on breast and baby, keeping baby awake @ breast. Starting with hand expression & breast compression to get baby working @ breast, and gentle stimulation to keep baby working @ breast. Encouraged EBM for nipple care post feed. LC services and milk storage shared. Encouraged mom to call for assist anytime desired.  Maternal Data Has patient been taught Hand Expression?: Yes Does the patient have breastfeeding experience prior to this delivery?: Yes How long did the patient breastfeed?: 6 months  Feeding Mother's Current Feeding Choice: Breast Milk and Formula  LATCH Score Latch: Grasps breast easily, tongue down, lips flanged, rhythmical sucking. (Free drops and a couple latches and baby takes over well)  Audible Swallowing: Spontaneous and intermittent  Type of Nipple: Everted at rest and after stimulation  Comfort (Breast/Nipple): Soft / non-tender  Hold  (Positioning): Assistance needed to correctly position infant at breast and maintain latch.  LATCH Score: 9   Lactation Tools Discussed/Used Tools: Pump;Flanges Flange Size: 21 Breast pump type: Manual Pump Education: Milk Storage;Setup, frequency, and cleaning  Interventions Interventions: Breast feeding basics reviewed;Assisted with latch;Hand express;Breast compression;Expressed milk;Hand pump;Education;LC Services brochure;CDC milk storage guidelines  Discharge Pump: DEBP;Manual;Hands Free;Personal  Consult Status Consult Status: Follow-up Date: 10/29/23 Follow-up type: In-patient    Sugar Vanzandt 10/28/2023, 11:19 AM

## 2023-10-29 LAB — BIRTH TISSUE RECOVERY COLLECTION (PLACENTA DONATION)

## 2023-10-29 LAB — CBC
HCT: 30.2 % — ABNORMAL LOW (ref 36.0–46.0)
Hemoglobin: 10.1 g/dL — ABNORMAL LOW (ref 12.0–15.0)
MCH: 29.4 pg (ref 26.0–34.0)
MCHC: 33.4 g/dL (ref 30.0–36.0)
MCV: 88 fL (ref 80.0–100.0)
Platelets: 318 K/uL (ref 150–400)
RBC: 3.43 MIL/uL — ABNORMAL LOW (ref 3.87–5.11)
RDW: 12.5 % (ref 11.5–15.5)
WBC: 12 K/uL — ABNORMAL HIGH (ref 4.0–10.5)
nRBC: 0 % (ref 0.0–0.2)

## 2023-10-29 MED ORDER — BENZOCAINE-MENTHOL 20-0.5 % EX AERO: 1.0000 | INHALATION_SPRAY | CUTANEOUS | 1 refills | Status: AC | PRN

## 2023-10-29 MED ORDER — OXYCODONE HCL 5 MG PO TABS: 5.0000 mg | ORAL_TABLET | ORAL | 0 refills | Status: AC | PRN

## 2023-10-29 MED ORDER — PRENATAL MULTIVITAMIN CH: 1.0000 | ORAL_TABLET | Freq: Every day | ORAL | 1 refills | Status: AC

## 2023-10-29 NOTE — Discharge Summary (Signed)
 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 yo G2P2002 at [redacted]w[redacted]d was admitted to the hospital 10/27/2023 for induction of labor.  Indication for induction: BPP6/10.  Patient had an labor course complicated bynothing Membrane Rupture Time/Date: 1:20 AM,10/28/2023  Delivery Method:Vaginal, Spontaneous Operative Delivery:N/A Episiotomy: None Lacerations:  None Details of delivery can be found in separate delivery note.  Patient had a postpartum course complicated by. Patient is discharged home 10/29/23.  Newborn Data: Birth date:10/28/2023 Birth time:4:23 AM Gender:Female Living status:Living Apgars:8 ,9  Weight:2870 g  Magnesium  Sulfate received: No BMZ received: No Rhophylac:No MMR:No T-DaP:  Flu:yes RSV Vaccine received: No Transfusion:No Immunizations administered: Immunization History  Administered Date(s) Administered   HPV 9-valent 03/01/2014, 04/25/2016, 04/08/2017   Influenza, Seasonal, Injecte, Preservative Fre 10/29/2023   Influenza,inj,Quad PF,6+ Mos 04/08/2017   Moderna Sars-Covid-2 Vaccination 12/19/2019   Tdap 10/18/2018,  10/29/2023    Physical exam  Vitals:   10/28/23 2043 10/28/23 2234 10/29/23 0459 10/29/23 1349  BP: (!) 140/80 117/85 119/74 123/84  Pulse: 80  93 88  Resp: 19  18 18   Temp: 98.2 F (36.8 C)  98.5 F (36.9 C) 98.3 F (36.8 C)  TempSrc: Oral  Oral Oral  SpO2: 100%  100%   Weight:      Height:       General: alert Lochia: appropriate Uterine Fundus: firm Incision: N/A DVT Evaluation: No evidence of DVT seen on physical exam. Labs: Lab Results  Component Value Date   WBC 12.0 (H) 10/29/2023   HGB 10.1 (L) 10/29/2023   HCT 30.2 (L) 10/29/2023   MCV 88.0 10/29/2023   PLT 318 10/29/2023      Latest Ref Rng & Units 08/31/2023    2:01 PM  CMP  Glucose 70 - 99 mg/dL 74   BUN 6 - 20 mg/dL <5   Creatinine 9.55 - 1.00 mg/dL 9.39   Sodium 864 - 854 mmol/L 137   Potassium 3.5 - 5.1 mmol/L 3.4   Chloride 98 - 111 mmol/L 110   CO2 22 - 32 mmol/L 18   Calcium 8.9 - 10.3 mg/dL 8.2   Total Protein 6.5 - 8.1 g/dL 5.5   Total Bilirubin 0.0 - 1.2 mg/dL 0.4   Alkaline Phos 38 - 126 U/L 108   AST 15 - 41 U/L 18   ALT 0 - 44 U/L 14    Edinburgh Score:    10/28/2023   11:00 AM  Edinburgh Postnatal Depression Scale Screening Tool  I have been able to laugh and see  the funny side of things. 0  I have looked forward with enjoyment to things. 0  I have blamed myself unnecessarily when things went wrong. 0  I have been anxious or worried for no good reason. 1  I have felt scared or panicky for no good reason. 1  Things have been getting on top of me. 0  I have been so unhappy that I have had difficulty sleeping. 0  I have felt sad or miserable. 0  I have been so unhappy that I have been crying. 0  The thought of harming myself has occurred to me. 0  Edinburgh Postnatal Depression Scale Total 2      After visit meds:  Allergies as of 10/29/2023       Reactions   Aspirin Anaphylaxis   Father & Siblings are allergic. So Mom doesn't even give to patient.        Medication  List     STOP taking these medications    acetaminophen  325 MG tablet Commonly known as: Tylenol        TAKE these medications    benzocaine -Menthol  20-0.5 % Aero Commonly known as: DERMOPLAST Apply 1 Application topically as needed for irritation (perineal discomfort).   oxyCODONE  5 MG immediate release tablet Commonly known as: Oxy IR/ROXICODONE  Take 1 tablet (5 mg total) by mouth every 4 (four) hours as needed (pain scale 4-7).   prenatal multivitamin Tabs tablet Take 1 tablet by mouth daily at 12 noon. Start taking on: October 30, 2023   Vitafol  Gummies 3.33-0.333-34.8 MG Chew Chew 3 tablets by mouth daily.         Discharge home in stable condition Infant Feeding: Breast Infant Disposition:home with mother Discharge instruction: per After Visit Summary and Postpartum booklet. Activity: Advance as tolerated. Pelvic rest for 6 weeks.  Diet: routine diet Anticipated Birth Control: Nexplanon  Postpartum Appointment:4 weeks Additional Postpartum F/U: pp Future Appointments:No future appointments. Follow up Visit:  Follow-up Information     Termaine Roupp, MD. Schedule an appointment as soon as possible for a visit in 4 week(s).   Specialty: Obstetrics and Gynecology Contact information: [address] [place] [address] [phone]                     10/29/2023 Ovid DELENA All, MD

## 2023-10-29 NOTE — Progress Notes (Signed)
 Pt and infant discharged to home. Nurse walked patient, infants father, and the infant to their vehicle. Infant buckled into rear-facing car seat. Patients sister driving them. AVS for infant and mother discussed with patient.

## 2023-10-29 NOTE — Progress Notes (Signed)
 CSW received a consult due to MDD and met MOB at bedside to complete a mental health assessment. CSW entered the room, introduced herself and explained the reason for the visit. MOB presented as calm, was agreeable to consult and remained engaged throughout encounter.  CSW congratulated MOB on the arrival of the infant;MOB said thank you. CSW inquired about MOB's mental health history. MOB reported being diagnosed with anxiety and depression during her hospital admission in 2019. MOB reported being prescribed a medication regiment that lasted until 2020 when she found about her first pregnancy; and she discontinued the medication regiment. MOB declined any current supportive regiments for her mental health. CSW asked MOB how has she cope with her mental health overtime. MOB reported she has changed her mindset and she a larger support system during this PP period. CSW asked MOB does she need therapy resources for this PP period; MOB declined. MOB reported her supports as FOB and her family. CSW provided education regarding the baby blues period vs. perinatal mood disorders, discussed treatment and gave resources for mental health follow up if concerns arise.  CSW recommends self-evaluation during the postpartum time period using the New Mom Checklist from Postpartum Progress and encouraged MOB to contact a medical professional if symptoms are noted at any time.  CSW assessed for safety with MOB SI/HI/DV;MOB denied all.  CSW asked MOB has she selected a pediatrician for the infant's follow up visits; MOB said [facility]. MOB reported having all essential items for the infant including a carseat, bassinet and crib for safe sleeping. CSW provided review of Sudden Infant Death Syndrome (SIDS) precautions.  CSW identifies no further need for intervention and no barriers to discharge at this time.  Virginia Bennett, [place] Clinical Social Worker [phone]

## 2023-11-06 ENCOUNTER — Telehealth (HOSPITAL_COMMUNITY): Payer: Self-pay

## 2023-11-06 NOTE — Telephone Encounter (Signed)
 11/06/2023 1815  Name: Virginia Bennett MRN: [id number] DOB: Aug 08, 1995  Reason for Call:  Transition of Care Hospital Discharge Call  Contact Status: Patient Contact Status: Complete  Language assistant needed:          Follow-Up Questions: Do You Have Any Concerns About Your Health As You Heal From Delivery?: No Do You Have Any Concerns About Your Infants Health?: No  Edinburgh Postnatal Depression Scale:  In the Past 7 Days: I have been able to laugh and see the funny side of things.: As much as I always could I have looked forward with enjoyment to things.: As much as I ever did I have blamed myself unnecessarily when things went wrong.: Not very often I have been anxious or worried for no good reason.: No, not at all I have felt scared or panicky for no good reason.: No, not at all Things have been getting on top of me.: No, I have been coping as well as ever I have been so unhappy that I have had difficulty sleeping.: Not at all I have felt sad or miserable.: No, not at all I have been so unhappy that I have been crying.: No, never The thought of harming myself has occurred to me.: Never [place] Postnatal Depression Scale Total: 1  PHQ2-9 Depression Scale:     Discharge Follow-up: Edinburgh score requires follow up?: No Patient was advised of the following resources:: Breastfeeding Support Group, Support Group  Post-discharge interventions: Reviewed Newborn Safe Sleep Practices  Signature  Rosaline Deretha PEAK
